# Patient Record
Sex: Female | Born: 1980 | Race: Black or African American | Hispanic: No | Marital: Single | Smoking: Never smoker
Health system: Southern US, Community
[De-identification: ages and names within clinical notes are randomized; demographics above are authoritative.]

## PROBLEM LIST (undated history)

## (undated) DIAGNOSIS — J45909 Unspecified asthma, uncomplicated: Secondary | ICD-10-CM

## (undated) DIAGNOSIS — D649 Anemia, unspecified: Secondary | ICD-10-CM

## (undated) HISTORY — PX: TUBAL LIGATION: SHX77

## (undated) HISTORY — PX: TONSILLECTOMY: SUR1361

---

## 2013-12-30 ENCOUNTER — Encounter (HOSPITAL_BASED_OUTPATIENT_CLINIC_OR_DEPARTMENT_OTHER): Payer: Self-pay | Admitting: Emergency Medicine

## 2013-12-30 ENCOUNTER — Emergency Department (HOSPITAL_BASED_OUTPATIENT_CLINIC_OR_DEPARTMENT_OTHER): Payer: Self-pay

## 2013-12-30 ENCOUNTER — Emergency Department (HOSPITAL_BASED_OUTPATIENT_CLINIC_OR_DEPARTMENT_OTHER)
Admission: EM | Admit: 2013-12-30 | Discharge: 2013-12-31 | Payer: Self-pay | Attending: Emergency Medicine | Admitting: Emergency Medicine

## 2013-12-30 DIAGNOSIS — Z79899 Other long term (current) drug therapy: Secondary | ICD-10-CM | POA: Insufficient documentation

## 2013-12-30 DIAGNOSIS — Z87891 Personal history of nicotine dependence: Secondary | ICD-10-CM | POA: Insufficient documentation

## 2013-12-30 DIAGNOSIS — J45901 Unspecified asthma with (acute) exacerbation: Secondary | ICD-10-CM | POA: Insufficient documentation

## 2013-12-30 HISTORY — DX: Unspecified asthma, uncomplicated: J45.909

## 2013-12-30 LAB — CBC WITH DIFFERENTIAL/PLATELET
BASOS PCT: 1 % (ref 0–1)
Basophils Absolute: 0 10*3/uL (ref 0.0–0.1)
Eosinophils Absolute: 1.3 10*3/uL — ABNORMAL HIGH (ref 0.0–0.7)
Eosinophils Relative: 15 % — ABNORMAL HIGH (ref 0–5)
HEMATOCRIT: 35.3 % — AB (ref 36.0–46.0)
HEMOGLOBIN: 11.8 g/dL — AB (ref 12.0–15.0)
LYMPHS ABS: 2.9 10*3/uL (ref 0.7–4.0)
Lymphocytes Relative: 33 % (ref 12–46)
MCH: 25.7 pg — ABNORMAL LOW (ref 26.0–34.0)
MCHC: 33.4 g/dL (ref 30.0–36.0)
MCV: 76.7 fL — ABNORMAL LOW (ref 78.0–100.0)
MONO ABS: 0.6 10*3/uL (ref 0.1–1.0)
MONOS PCT: 7 % (ref 3–12)
NEUTROS ABS: 4 10*3/uL (ref 1.7–7.7)
Neutrophils Relative %: 45 % (ref 43–77)
Platelets: 479 10*3/uL — ABNORMAL HIGH (ref 150–400)
RBC: 4.6 MIL/uL (ref 3.87–5.11)
RDW: 15.5 % (ref 11.5–15.5)
WBC: 8.8 10*3/uL (ref 4.0–10.5)

## 2013-12-30 LAB — BASIC METABOLIC PANEL
BUN: 12 mg/dL (ref 6–23)
CHLORIDE: 103 meq/L (ref 96–112)
CO2: 21 meq/L (ref 19–32)
Calcium: 9.9 mg/dL (ref 8.4–10.5)
Creatinine, Ser: 0.9 mg/dL (ref 0.50–1.10)
GFR calc non Af Amer: 82 mL/min — ABNORMAL LOW (ref >90)
Glucose, Bld: 126 mg/dL — ABNORMAL HIGH (ref 70–99)
POTASSIUM: 3.3 meq/L — AB (ref 3.7–5.3)
Sodium: 142 mEq/L (ref 137–147)

## 2013-12-30 MED ORDER — SODIUM CHLORIDE 0.9 % IV SOLN
Freq: Once | INTRAVENOUS | Status: AC
  Administered 2013-12-30: 1000 mL via INTRAVENOUS

## 2013-12-30 MED ORDER — ALBUTEROL SULFATE (2.5 MG/3ML) 0.083% IN NEBU
5.0000 mg | INHALATION_SOLUTION | Freq: Once | RESPIRATORY_TRACT | Status: AC
  Administered 2013-12-30: 5 mg via RESPIRATORY_TRACT
  Filled 2013-12-30: qty 6

## 2013-12-30 MED ORDER — MAGNESIUM SULFATE 50 % IJ SOLN
1.0000 g | Freq: Once | INTRAMUSCULAR | Status: AC
  Administered 2013-12-31: 1 g via INTRAVENOUS
  Filled 2013-12-30: qty 2

## 2013-12-30 MED ORDER — METHYLPREDNISOLONE SODIUM SUCC 125 MG IJ SOLR
125.0000 mg | Freq: Once | INTRAMUSCULAR | Status: AC
  Administered 2013-12-30: 125 mg via INTRAVENOUS
  Filled 2013-12-30: qty 2

## 2013-12-30 MED ORDER — IPRATROPIUM BROMIDE 0.02 % IN SOLN
0.5000 mg | Freq: Once | RESPIRATORY_TRACT | Status: AC
  Administered 2013-12-30: 0.5 mg via RESPIRATORY_TRACT
  Filled 2013-12-30: qty 2.5

## 2013-12-30 MED ORDER — ALBUTEROL (5 MG/ML) CONTINUOUS INHALATION SOLN
10.0000 mg/h | INHALATION_SOLUTION | RESPIRATORY_TRACT | Status: AC
  Administered 2013-12-30: 10 mg/h via RESPIRATORY_TRACT
  Filled 2013-12-30: qty 20

## 2013-12-30 NOTE — ED Provider Notes (Signed)
CSN: [id number]     Arrival date & time 12/30/13  2005 History   First MD Initiated Contact with Patient 12/30/13 2054     Chief Complaint  Patient presents with  . Shortness of Breath     (Consider location/radiation/quality/duration/timing/severity/associated sxs/prior Treatment) Patient is a 33 y.o. female presenting with shortness of breath. The history is provided by the patient. No language interpreter was used.  Shortness of Breath Severity:  Severe Onset quality:  Gradual Associated symptoms: wheezing   Associated symptoms: no abdominal pain, no chest pain, no fever, no headaches, no rash and no vomiting   Associated symptoms comment:  The patient has a history of asthma. She reports increased wheezing without fever and being out of her inhaler until today. She reports persistent worsening wheezing and SOB throughout the day without relief with inhaler or with use of her mother's nebulizer machine. She denies ever being hospitalized for her asthma.   Past Medical History  Diagnosis Date  . Asthma    Past Surgical History  Procedure Laterality Date  . Tubal ligation    . Tonsillectomy     No family history on file. History  Substance Use Topics  . Smoking status: Former Games developer  . Smokeless tobacco: Never Used  . Alcohol Use: Yes     Comment: occasional   OB History   Grav Para Term Preterm Abortions TAB SAB Ect Mult Living                 Review of Systems  Constitutional: Negative for fever.  HENT: Negative for congestion.   Respiratory: Positive for chest tightness, shortness of breath and wheezing.   Cardiovascular: Negative for chest pain.  Gastrointestinal: Negative for vomiting and abdominal pain.  Musculoskeletal: Negative for myalgias.  Skin: Negative for rash.  Neurological: Negative for weakness and headaches.      Allergies  Review of patient's allergies indicates no known allergies.  Home Medications   Current Outpatient Rx  Name  Route   Sig  Dispense  Refill  . albuterol (PROVENTIL HFA;VENTOLIN HFA) 108 (90 BASE) MCG/ACT inhaler   Inhalation   Inhale into the lungs every 6 (six) hours as needed for wheezing or shortness of breath.         . guaiFENesin (MUCINEX) 600 MG 12 hr tablet   Oral   Take by mouth 2 (two) times daily.          BP 120/71  Pulse 119  Temp(Src) 99 F (37.2 C) (Oral)  Resp 25  Ht 5\' 5"  (1.651 m)  Wt 160 lb (72.576 kg)  BMI 26.63 kg/m2  SpO2 96%  LMP 12/23/2013 Physical Exam  Constitutional: She is oriented to person, place, and time. She appears well-developed and well-nourished.  HENT:  Head: Normocephalic.  Neck: Normal range of motion. Neck supple.  Cardiovascular: Normal rate and regular rhythm.   Pulmonary/Chest: Effort normal. She has wheezes. She has no rales. She exhibits no tenderness.  Abdominal: Soft. Bowel sounds are normal. There is no tenderness. There is no rebound and no guarding.  Musculoskeletal: Normal range of motion.  Neurological: She is alert and oriented to person, place, and time.  Skin: Skin is warm and dry. No rash noted.  Psychiatric: She has a normal mood and affect.    ED Course  Procedures (including critical care time) Labs Review Labs Reviewed  CBC WITH DIFFERENTIAL - Abnormal; Notable for the following:    Hemoglobin 11.8 (*)    HCT 35.3 (*)  MCV 76.7 (*)    MCH 25.7 (*)    Platelets 479 (*)    Eosinophils Relative 15 (*)    Eosinophils Absolute 1.3 (*)    All other components within normal limits  BASIC METABOLIC PANEL - Abnormal; Notable for the following:    Potassium 3.3 (*)    Glucose, Bld 126 (*)    GFR calc non Af Amer 82 (*)    All other components within normal limits   Imaging Review No results found.   EKG Interpretation None      MDM   Final diagnoses:  None    1. Asthma exacerbation  She has had nebulizer treatments x 2 with 5 mg Albuterol (2) and ATrovent 0.5 (1), as well as a 10 mg Albuterol one hour  treatment without significant change in wheezing. Patient still maintaining O2 saturations of upper 90's, however has prominent persistent wheeze with prolonged expirations. Magnesium and Solumedrol given. Will admit for further management of asthma exacerbation. [facility].     Arnoldo HookerShari A Hassen Bruun, [place] JerseyPA-C 12/30/13 2333

## 2013-12-30 NOTE — ED Notes (Signed)
Out of inhaler for asthma- just refilled and used x 1 day without relief- RT evaluating in triage

## 2013-12-30 NOTE — ED Provider Notes (Signed)
Medical screening examination/treatment/procedure(s) were performed by non-physician practitioner and as supervising physician I was immediately available for consultation/collaboration.   EKG Interpretation None       Ethelda ChickMartha K Linker, MD 12/30/13 [phone]

## 2013-12-31 LAB — I-STAT ARTERIAL BLOOD GAS, ED
Acid-Base Excess: 1 mmol/L (ref 0.0–2.0)
Bicarbonate: 21.7 mEq/L (ref 20.0–24.0)
O2 Saturation: 96 %
PO2 ART: 66 mmHg — AB (ref 80.0–100.0)
Patient temperature: 98.3
TCO2: 22 mmol/L (ref 0–100)
pCO2 arterial: 23.5 mmHg — ABNORMAL LOW (ref 35.0–45.0)
pH, Arterial: 7.573 — ABNORMAL HIGH (ref 7.350–7.450)

## 2013-12-31 NOTE — ED Notes (Signed)
[facility]

## 2015-01-12 ENCOUNTER — Encounter (HOSPITAL_BASED_OUTPATIENT_CLINIC_OR_DEPARTMENT_OTHER): Payer: Self-pay

## 2015-01-12 ENCOUNTER — Emergency Department (HOSPITAL_BASED_OUTPATIENT_CLINIC_OR_DEPARTMENT_OTHER): Payer: No Typology Code available for payment source

## 2015-01-12 ENCOUNTER — Emergency Department (HOSPITAL_BASED_OUTPATIENT_CLINIC_OR_DEPARTMENT_OTHER)
Admission: EM | Admit: 2015-01-12 | Discharge: 2015-01-12 | Disposition: A | Discharge status: Home / Self Care | Payer: No Typology Code available for payment source | Attending: Emergency Medicine | Admitting: Emergency Medicine

## 2015-01-12 DIAGNOSIS — T148XXA Other injury of unspecified body region, initial encounter: Secondary | ICD-10-CM

## 2015-01-12 DIAGNOSIS — J45909 Unspecified asthma, uncomplicated: Secondary | ICD-10-CM | POA: Diagnosis not present

## 2015-01-12 DIAGNOSIS — Z87891 Personal history of nicotine dependence: Secondary | ICD-10-CM | POA: Diagnosis not present

## 2015-01-12 DIAGNOSIS — D72829 Elevated white blood cell count, unspecified: Secondary | ICD-10-CM | POA: Diagnosis not present

## 2015-01-12 DIAGNOSIS — S46911A Strain of unspecified muscle, fascia and tendon at shoulder and upper arm level, right arm, initial encounter: Secondary | ICD-10-CM | POA: Diagnosis not present

## 2015-01-12 DIAGNOSIS — S3992XA Unspecified injury of lower back, initial encounter: Secondary | ICD-10-CM | POA: Diagnosis present

## 2015-01-12 DIAGNOSIS — Z3202 Encounter for pregnancy test, result negative: Secondary | ICD-10-CM | POA: Insufficient documentation

## 2015-01-12 DIAGNOSIS — S39012A Strain of muscle, fascia and tendon of lower back, initial encounter: Secondary | ICD-10-CM | POA: Diagnosis not present

## 2015-01-12 DIAGNOSIS — Y92410 Unspecified street and highway as the place of occurrence of the external cause: Secondary | ICD-10-CM | POA: Insufficient documentation

## 2015-01-12 DIAGNOSIS — M791 Myalgia, unspecified site: Secondary | ICD-10-CM

## 2015-01-12 DIAGNOSIS — Z79899 Other long term (current) drug therapy: Secondary | ICD-10-CM | POA: Insufficient documentation

## 2015-01-12 DIAGNOSIS — Y999 Unspecified external cause status: Secondary | ICD-10-CM | POA: Insufficient documentation

## 2015-01-12 DIAGNOSIS — S161XXA Strain of muscle, fascia and tendon at neck level, initial encounter: Secondary | ICD-10-CM | POA: Diagnosis not present

## 2015-01-12 DIAGNOSIS — Y9389 Activity, other specified: Secondary | ICD-10-CM | POA: Diagnosis not present

## 2015-01-12 LAB — URINALYSIS, ROUTINE W REFLEX MICROSCOPIC
Bilirubin Urine: NEGATIVE
Glucose, UA: NEGATIVE mg/dL
Ketones, ur: NEGATIVE mg/dL
Nitrite: NEGATIVE
PH: 6 (ref 5.0–8.0)
PROTEIN: NEGATIVE mg/dL
Specific Gravity, Urine: 1.021 (ref 1.005–1.030)
Urobilinogen, UA: 1 mg/dL (ref 0.0–1.0)

## 2015-01-12 LAB — URINE MICROSCOPIC-ADD ON

## 2015-01-12 LAB — PREGNANCY, URINE: PREG TEST UR: NEGATIVE

## 2015-01-12 MED ORDER — CYCLOBENZAPRINE HCL 10 MG PO TABS: 5.0000 mg | ORAL_TABLET | Freq: Every day | ORAL | Status: DC

## 2015-01-12 MED ORDER — METRONIDAZOLE 500 MG PO TABS: 500.0000 mg | ORAL_TABLET | Freq: Two times a day (BID) | ORAL | Status: DC
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
## 2015-01-12 NOTE — Discharge Instructions (Signed)
Please call your doctor for a followup appointment within 24-48 hours. When you talk to your doctor please let them know that you were seen in the emergency department and have them acquire all of your records so that they can discuss the findings with you and formulate a treatment plan to fully care for your new and ongoing problems. Please follow-up with health and wellness Center Please follow-up with orthopedics Please rest and stay hydrated Please apply warm compressions and massage Please avoid any physical or strenuous activity Please follow up with women's outpatient clinic regarding Trich noted in urine - please have partner(s) within the past 6 months tested as well. Will need further work up for possible sexually transmitted disease (STD) Please take medications as prescribed. While taking Flexeril this is a muscle relaxer, this can cause drowsiness-please no drinking alcohol, driving, operating any heavy machinery. Please continue to monitor symptoms closely and if symptoms are to worsen or change (fever greater than 101, chills, sweating, nausea, vomiting, chest pain, shortness of breathe, difficulty breathing, weakness, numbness, tingling, worsening or changes to pain pattern, headache, dizziness, loss of sensation, blurred vision, sudden loss of vision, and abuse swallow, neck swelling, neck pain, throat closing sensation, inability to control urine or bowel movements, fall, injury) please report back to the Emergency Department immediately.   Lumbosacral Strain Lumbosacral strain is a strain of any of the parts that make up your lumbosacral vertebrae. Your lumbosacral vertebrae are the bones that make up the lower third of your backbone. Your lumbosacral vertebrae are held together by muscles and tough, fibrous tissue (ligaments).  CAUSES  A sudden blow to your back can cause lumbosacral strain. Also, anything that causes an excessive stretch of the muscles in the low back can cause this  strain. This is typically seen when people exert themselves strenuously, fall, lift heavy objects, bend, or crouch repeatedly. RISK FACTORS  Physically demanding work.  Participation in pushing or pulling sports or sports that require a sudden twist of the back (tennis, golf, baseball).  Weight lifting.  Excessive lower back curvature.  Forward-tilted pelvis.  Weak back or abdominal muscles or both.  Tight hamstrings. SIGNS AND SYMPTOMS  Lumbosacral strain may cause pain in the area of your injury or pain that moves (radiates) down your leg.  DIAGNOSIS Your health care provider can often diagnose lumbosacral strain through a physical exam. In some cases, you may need tests such as X-ray exams.  TREATMENT  Treatment for your lower back injury depends on many factors that your clinician will have to evaluate. However, most treatment will include the use of anti-inflammatory medicines. HOME CARE INSTRUCTIONS   Avoid hard physical activities (tennis, racquetball, waterskiing) if you are not in proper physical condition for it. This may aggravate or create problems.  If you have a back problem, avoid sports requiring sudden body movements. Swimming and walking are generally safer activities.  Maintain good posture.  Maintain a healthy weight.  For acute conditions, you may put ice on the injured area.  Put ice in a plastic bag.  Place a towel between your skin and the bag.  Leave the ice on for 20 minutes, 2-3 times a day.  When the low back starts healing, stretching and strengthening exercises may be recommended. SEEK MEDICAL CARE IF:  Your back pain is getting worse.  You experience severe back pain not relieved with medicines. SEEK IMMEDIATE MEDICAL CARE IF:   You have numbness, tingling, weakness, or problems with the use of  your arms or legs.  There is a change in bowel or bladder control.  You have increasing pain in any area of the body, including your belly  (abdomen).  You notice shortness of breath, dizziness, or feel faint.  You feel sick to your stomach (nauseous), are throwing up (vomiting), or become sweaty.  You notice discoloration of your toes or legs, or your feet get very cold. MAKE SURE YOU:   Understand these instructions.  Will watch your condition.  Will get help right away if you are not doing well or get worse. Document Released: 06/17/2005 Document Revised: 09/12/2013 Document Reviewed: 04/26/2013 [facility] Patient Information 2015 [place], [place]. This information is not intended to replace advice given to you by your health care provider. Make sure you discuss any questions you have with your health care provider.  Muscle Pain Muscle pain (myalgia) may be caused by many things, including:  Overuse or muscle strain, especially if you are not in shape. This is the most common cause of muscle pain.  Injury.  Bruises.  Viruses, such as the flu.  Infectious diseases.  Fibromyalgia, which is a chronic condition that causes muscle tenderness, fatigue, and headache.  Autoimmune diseases, including lupus.  Certain drugs, including ACE inhibitors and statins. Muscle pain may be mild or severe. In most cases, the pain lasts only a short time and goes away without treatment. To diagnose the cause of your muscle pain, your health care provider will take your medical history. This means he or she will ask you when your muscle pain began and what has been happening. If you have not had muscle pain for very long, your health care provider may want to wait before doing much testing. If your muscle pain has lasted a long time, your health care provider may want to run tests right away. If your health care provider thinks your muscle pain may be caused by illness, you may need to have additional tests to rule out certain conditions.  Treatment for muscle pain depends on the cause. Home care is often enough to relieve muscle pain. Your  health care provider may also prescribe anti-inflammatory medicine. HOME CARE INSTRUCTIONS Watch your condition for any changes. The following actions may help to lessen any discomfort you are feeling:  Only take over-the-counter or prescription medicines as directed by your health care provider.  Apply ice to the sore muscle:  Put ice in a plastic bag.  Place a towel between your skin and the bag.  Leave the ice on for 15-20 minutes, 3-4 times a day.  You may alternate applying hot and cold packs to the muscle as directed by your health care provider.  If overuse is causing your muscle pain, slow down your activities until the pain goes away.  Remember that it is normal to feel some muscle pain after starting a workout program. Muscles that have not been used often will be sore at first.  Do regular, gentle exercises if you are not usually active.  Warm up before exercising to lower your risk of muscle pain.  Do not continue working out if the pain is very bad. Bad pain could mean you have injured a muscle. SEEK MEDICAL CARE IF:  Your muscle pain gets worse, and medicines do not help.  You have muscle pain that lasts longer than 3 days.  You have a rash or fever along with muscle pain.  You have muscle pain after a tick bite.  You have muscle pain while  working out, even though you are in good physical condition.  You have redness, soreness, or swelling along with muscle pain.  You have muscle pain after starting a new medicine or changing the dose of a medicine. SEEK IMMEDIATE MEDICAL CARE IF:  You have trouble breathing.  You have trouble swallowing.  You have muscle pain along with a stiff neck, fever, and vomiting.  You have severe muscle weakness or cannot move part of your body. MAKE SURE YOU:   Understand these instructions.  Will watch your condition.  Will get help right away if you are not doing well or get worse. Document Released: 07/30/2006  Document Revised: 09/12/2013 Document Reviewed: 07/04/2013 [facility] Patient Information 2015 [place], [place]. This information is not intended to replace advice given to you by your health care provider. Make sure you discuss any questions you have with your health care provider.   Emergency Department Resource Guide 1) Find a Doctor and Pay Out of Pocket Although you won't have to find out who is covered by your insurance plan, it is a good idea to ask around and get recommendations. You will then need to call the office and see if the doctor you have chosen will accept you as a new patient and what types of options they offer for patients who are self-pay. Some doctors offer discounts or will set up payment plans for their patients who do not have insurance, but you will need to ask so you aren't surprised when you get to your appointment.  2) Contact Your Local Health Department Not all health departments have doctors that can see patients for sick visits, but many do, so it is worth a call to see if yours does. If you don't know where your local health department is, you can check in your phone book. The CDC also has a tool to help you locate your state's health department, and many state websites also have listings of all of their local health departments.  3) Find a Walk-in Clinic If your illness is not likely to be very severe or complicated, you may want to try a walk in clinic. These are popping up all over the country in pharmacies, drugstores, and shopping centers. They're usually staffed by nurse practitioners or physician assistants that have been trained to treat common illnesses and complaints. They're usually fairly quick and inexpensive. However, if you have serious medical issues or chronic medical problems, these are probably not your best option.  No Primary Care Doctor: - Call Health Connect at  [phone] - they can help you locate a primary care doctor that  accepts your  insurance, provides certain services, etc. - Physician Referral Service- [phone]  Chronic Pain Problems: Organization         Address  Phone   Notes  [facility]  [phone] Patients need to be referred by their primary care doctor.   Medication Assistance: Organization         Address  Phone   Notes  [facility] Medication [facility] [address] [place] [place], [place] [address] [phone] --Must be a resident of [facility] -- Must have NO insurance coverage whatsoever (no Medicaid/ Medicare, etc.) -- The pt. MUST have a primary care doctor that directs their care regularly and follows them in the community   [facility]ist  [phone]   [employer]  [phone]    Agencies that provide inexpensive medical care: Organization  Address  Phone   Notes  [facility]  [phone]   [facility]    [phone]   [facility] [address] [place], [place] [address] [phone]   [facility] [address], [place] [phone](336) [phone]   Planned Parenthood    [phone]   [facility]    [phone](336) [phone]   [facility]  [address], [place] Phone:  [phone](336) [phone], Fax:  [phone] Hours of Operation:  9 am - 6 pm, M-F.  Also accepts Medicaid/Medicare and self-pay.  [facility]  [address], [place] Phone: [phone]) [phone], Fax: [phone]) [phone]. Hours of Operation:  8:30 am - 5:30 pm, M-F.  Also accepts Medicaid and self-pay.  [facility] [place] [address], [place] Phone: [phone]) [phone]   [facility] [address], [place], [place] [phone], Ext. [phone] Mondays & Thursdays: 7-9 AM.  First 15 patients are seen on a first come, first serve basis.    Medicaid-accepting [facility] Providers:  Organization          Address  Phone   Notes  [facility] [address], [place] [phone]) [phone] Also accepts self-pay patients.  [facility] [address], [place]  [phone]) [phone]   [facility] [address], [address], [place] [phone](336) [phone]   [facility] [address] [place] [address], [place] [phone]) [phone]   Renaye RakersVeita Bland [address], [place]   [phone] Only accepts [place] Access [place] patients after they have their name applied to their card.   Self-Pay (no insurance) in [facility]:  Organization         Address  Phone   Notes  Sickle Cell Patients, [facility] [address], [place] [phone]) [phone]   [facility] [address], [place] [phone]) [phone]   [facility] [place]  [address], [place] [phone]) [phone]   [facility]/Dr. Osei-Bonsu  [address] [place] [address], [place] or [phone], [place] [phone] Phone number for both [place] and [place] locations is the same.  [facility] [address], [place] [phone]   [facility] [address] [place] [address], [place] or [address] [phone](336) [phone]   [facility] [address], [place] [phone]) [phone], phone; [phone]) [phone], fax Sees patients 1st and 3rd Saturday of every month.  Must not qualify for public or private insurance (i.e. Medicaid, Medicare, [place] Health Choice, Veterans' Benefits)  Household income should be no more than 200% of the poverty level The clinic cannot treat you if you are pregnant or think you are pregnant  Sexually transmitted diseases are not treated at the clinic.    Dental Care: Organization         Address  Phone  Notes  [facility] Department of [facility] [address],  [place] [phone]) [phone] Accepts children up to age 35 who are enrolled in [place] or [place] Health Choice; pregnant women with a Medicaid card; and children who have applied for Medicaid or [employer] Health Choice, but were declined, whose parents can pay a reduced fee at time  of service.  [facility] Department of [facility]  [address], [place] [phone] Accepts children up to age 7 who are enrolled in [place] or [place] Health Choice; pregnant women with a Medicaid card; and children who have applied for Medicaid or [place] Health Choice, but were declined, whose parents can pay a reduced fee at time of service.  [facility]  [address] [place], [place] [phone] Patients are seen by appointment only. Walk-ins are not accepted. [facility] will see patients 62 years of age and older. Monday - Tuesday (8am-5pm) Most Wednesdays (8:30-5pm) $30 per visit, cash only  [facility] Access [facility]  [address], [facility] [phone] Patients are seen by appointment only. Walk-ins are not accepted. [facility] will see patients 76 years of age and older. One Wednesday Evening (Monthly: Volunteer Based).  $30 per visit, cash only  [employer]  [phone] for adults; Children under age 49, call Graduate Pediatric Dentistry at [phone]. Children aged 27-14, please call [phone] to request a pediatric application.  Dental services are provided in all areas of dental care including fillings, crowns and bridges, complete and partial dentures, implants, gum treatment, root canals, and extractions. Preventive care is also provided. Treatment is provided to both adults and children. Patients are selected via a lottery and there is often a waiting list.   [facility] [address] [place]  [phone] [email or web address]   [facility] [address] [place], [place]  [phone], Ext. [phone] Second and Fourth Thursday of each month, opens at 6:30 AM; Clinic ends at 9 AM.  Patients are seen on a first-come first-served basis, and a limited number are seen during each clinic.   [facility]  [address] [place] [place], [place] [phone]   Eligibility Requirements You must have lived in [place], [place], or [place] counties for at least the last three months.   You cannot be eligible for state or federal sponsored [place], including [employer], [place], or [employer].   You generally cannot be eligible for healthcare insurance through your employer.    How to apply: Eligibility screenings are held every Tuesday and Wednesday afternoon from 1:00 pm until 4:00 pm. You do not need an appointment for the interview!  [facility] [address], [place], [place] [phone]   [facility] Health Department  [phone]   [facility] Health Department  [phone]   [facility] Health Department  [phone]    Behavioral Health Resources in the Community: Intensive Outpatient Programs Organization         Address  Phone  Notes  [facility] Services [address] [place], [place] [phone]   [facility] Outpatient [address], [place], [place] [phone]   [facility] [address], [place], [place]  [phone]   [facility] Mental Health [address],  [place], [place] [phone] or [phone]   Substance Abuse Resources Organization         Address  Phone  Notes  [facility] Drug Services  [phone]   [facility]  [phone]   [facility] [place]  [phone]   Floydene Flock  [phone]   Residential & Outpatient Substance Abuse Program  [phone]   Psychological Services Organization         Address  Phone  Notes  [facility]  [phone]- [phone]   [facility]  [phone]- [phone]    [facility] [address], [place] [phone] or [phone]    Mobile Crisis Teams Organization         Address  Phone  Notes  [facility], Mobile Crisis Care Unit  [phone]   [facility]  [address] [place], [place] [phone]   Doristine LocksSharon DeEsch [address] [place] [phone]    Self-Help/Support Groups Organization         Address  Phone             Notes  [facility] [place] - variety of support groups  [phone]- [phone] Call for more information  Narcotics Anonymous (NA), [facility] [address], [place]  2 meetings at this location   Statisticianesidential Treatment Programs Organization         Address  Phone  Notes  [facility] [address],    [place]  [phone]   [facility]  [address], [place] [phone], [place], [place] [phone]   [facility] [address], [place] [phone] Admissions: 8am-3pm M-F  [facility] [address]    [place], [place] [phone]   [facility] [address], [place], [place] [phone]   [facility] [address]  [place], [place] [phone]   [facility] - Intensive Outpatient [address], [place], [place] [phone]   [facility] ([facility] [address]  [place], [place] [phone] or [phone]   [facility] ([facility]) [address] [place], [place] [phone] Accepts Medicaid  [facility] [address]  [place] [phone] Substance Abuse/Addiction Treatment   [facility] Behavioral Health Resources Organization         Address  Phone  Notes  [facility]  [phone]   Angie FavaJulie Brannon, PhD [address], [address] [place], [place]   [phone](336) [phone] or [phone]) [phone]   [facility]   [address] [place], [place] [phone](336) [phone]   [facility] [address], [place], [place] [phone]) [phone] Insurance/Medicaid/sponsorship through [facility]  [facility] [address]                                    [place], [place] [phone]) [phone] Therapy/tele-psych/case  [facility] [address].   [place], [place] [phone](336) [phone]    Dr. Lolly MustacheArfeen  [phone](336) [phone]   [facility] [place]  [employer] [facility] 1) [address], [place] 2) [address], [place] 3)  [address], [place] [phone]) [phone](336) [phone]  [phone](336) [phone]   [facility] Child Abuse Hotline [phone](336) [phone] or [phone](336) [phone] (After Hours)
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
## 2015-02-16 ENCOUNTER — Encounter (HOSPITAL_BASED_OUTPATIENT_CLINIC_OR_DEPARTMENT_OTHER): Payer: Self-pay

## 2015-02-16 ENCOUNTER — Emergency Department (HOSPITAL_BASED_OUTPATIENT_CLINIC_OR_DEPARTMENT_OTHER)
Admission: EM | Admit: 2015-02-16 | Discharge: 2015-02-16 | Disposition: A | Discharge status: Home / Self Care | Payer: Self-pay | Attending: Emergency Medicine | Admitting: Emergency Medicine

## 2015-02-16 DIAGNOSIS — Z3202 Encounter for pregnancy test, result negative: Secondary | ICD-10-CM | POA: Insufficient documentation

## 2015-02-16 DIAGNOSIS — N390 Urinary tract infection, site not specified: Secondary | ICD-10-CM | POA: Insufficient documentation

## 2015-02-16 DIAGNOSIS — Z79899 Other long term (current) drug therapy: Secondary | ICD-10-CM | POA: Insufficient documentation

## 2015-02-16 DIAGNOSIS — Z87891 Personal history of nicotine dependence: Secondary | ICD-10-CM | POA: Insufficient documentation

## 2015-02-16 DIAGNOSIS — Z792 Long term (current) use of antibiotics: Secondary | ICD-10-CM | POA: Insufficient documentation

## 2015-02-16 DIAGNOSIS — J45909 Unspecified asthma, uncomplicated: Secondary | ICD-10-CM | POA: Insufficient documentation

## 2015-02-16 LAB — URINE MICROSCOPIC-ADD ON

## 2015-02-16 LAB — URINALYSIS, ROUTINE W REFLEX MICROSCOPIC
BILIRUBIN URINE: NEGATIVE
Glucose, UA: NEGATIVE mg/dL
Ketones, ur: NEGATIVE mg/dL
Nitrite: POSITIVE — AB
PH: 6 (ref 5.0–8.0)
Protein, ur: NEGATIVE mg/dL
Specific Gravity, Urine: 1.025 (ref 1.005–1.030)
Urobilinogen, UA: 0.2 mg/dL (ref 0.0–1.0)

## 2015-02-16 LAB — PREGNANCY, URINE: PREG TEST UR: NEGATIVE

## 2015-02-16 MED ORDER — SULFAMETHOXAZOLE-TRIMETHOPRIM 800-160 MG PO TABS
1.0000 | ORAL_TABLET | Freq: Once | ORAL | Status: AC
  Administered 2015-02-16: 1 via ORAL
  Filled 2015-02-16: qty 1

## 2015-02-16 MED ORDER — SULFAMETHOXAZOLE-TRIMETHOPRIM 800-160 MG PO TABS: 1.0000 | ORAL_TABLET | Freq: Two times a day (BID) | ORAL | Status: AC

## 2015-02-16 MED ORDER — PHENAZOPYRIDINE HCL 200 MG PO TABS: 200.0000 mg | ORAL_TABLET | Freq: Three times a day (TID) | ORAL | Status: DC

## 2015-02-16 MED ORDER — PHENAZOPYRIDINE HCL 100 MG PO TABS
100.0000 mg | ORAL_TABLET | Freq: Once | ORAL | Status: AC
  Administered 2015-02-16: 100 mg via ORAL
  Filled 2015-02-16: qty 1

## 2015-02-16 NOTE — ED Provider Notes (Signed)
CSN: [id number]     Arrival date & time 02/16/15  1329 History   First MD Initiated Contact with Patient 02/16/15 1340     Chief Complaint  Patient presents with  . Dysuria      HPI  Patient presents for evaluation of burning with urination and urinary frequency for last 3 days. Denies pregnancy. No fevers no chills. No back pain flank pain. No vaginal bleeding discharge or other abnormalities noted.  Past Medical History  Diagnosis Date  . Asthma    Past Surgical History  Procedure Laterality Date  . Tubal ligation    . Tonsillectomy     No family history on file. History  Substance Use Topics  . Smoking status: Former Games developermoker  . Smokeless tobacco: Never Used  . Alcohol Use: No     Comment: occasional   OB History    No data available     Review of Systems  Constitutional: Negative for fever, chills, diaphoresis, appetite change and fatigue.  HENT: Negative for mouth sores, sore throat and trouble swallowing.   Eyes: Negative for visual disturbance.  Respiratory: Negative for cough, chest tightness, shortness of breath and wheezing.   Cardiovascular: Negative for chest pain.  Gastrointestinal: Negative for nausea, vomiting, abdominal pain, diarrhea and abdominal distention.  Endocrine: Negative for polydipsia, polyphagia and polyuria.  Genitourinary: Positive for dysuria, urgency and frequency. Negative for hematuria.  Musculoskeletal: Negative for gait problem.  Skin: Negative for color change, pallor and rash.  Neurological: Negative for dizziness, syncope, light-headedness and headaches.  Hematological: Does not bruise/bleed easily.  Psychiatric/Behavioral: Negative for behavioral problems and confusion.      Allergies  Review of patient's allergies indicates no known allergies.  Home Medications   Prior to Admission medications   Medication Sig Start Date End Date Taking? Authorizing Provider  albuterol (PROVENTIL HFA;VENTOLIN HFA) 108 (90 BASE) MCG/ACT  inhaler Inhale into the lungs every 6 (six) hours as needed for wheezing or shortness of breath.    Historical Provider, MD  cyclobenzaprine (FLEXERIL) 10 MG tablet Take 0.5 tablets (5 mg total) by mouth at bedtime. 01/12/15   Marissa Sciacca, PA-C  guaiFENesin (MUCINEX) 600 MG 12 hr tablet Take by mouth 2 (two) times daily.    Historical Provider, MD  phenazopyridine (PYRIDIUM) 200 MG tablet Take 1 tablet (200 mg total) by mouth 3 (three) times daily. 02/16/15   Rolland PorterMark Shaquavia Whisonant, MD  sulfamethoxazole-trimethoprim (BACTRIM DS,SEPTRA DS) 800-160 MG per tablet Take 1 tablet by mouth 2 (two) times daily. 02/16/15 02/23/15  Rolland PorterMark Lauretta Sallas, MD   LMP 01/31/2015 Physical Exam  Constitutional: She is oriented to person, place, and time. She appears well-developed and well-nourished. No distress.  HENT:  Head: Normocephalic.  Eyes: Conjunctivae are normal. Pupils are equal, round, and reactive to light. No scleral icterus.  Neck: Normal range of motion. Neck supple. No thyromegaly present.  Cardiovascular: Normal rate and regular rhythm.  Exam reveals no gallop and no friction rub.   No murmur heard. Pulmonary/Chest: Effort normal and breath sounds normal. No respiratory distress. She has no wheezes. She has no rales.  Abdominal: Soft. Bowel sounds are normal. She exhibits no distension. There is no tenderness. There is no rebound.    Musculoskeletal: Normal range of motion.  Neurological: She is alert and oriented to person, place, and time.  Skin: Skin is warm and dry. No rash noted.  Psychiatric: She has a normal mood and affect. Her behavior is normal.    ED Course  Procedures (  including critical care time) Labs Review Labs Reviewed  URINALYSIS, ROUTINE W REFLEX MICROSCOPIC (NOT AT [facility]) - Abnormal; Notable for the following:    APPearance CLOUDY (*)    Hgb urine dipstick SMALL (*)    Nitrite POSITIVE (*)    Leukocytes, UA LARGE (*)    All other components within normal limits  URINE MICROSCOPIC-ADD  ON - Abnormal; Notable for the following:    Squamous Epithelial / LPF MANY (*)    Bacteria, UA MANY (*)    All other components within normal limits  PREGNANCY, URINE    Imaging Review No results found.   EKG Interpretation None      MDM   Final diagnoses:  UTI (lower urinary tract infection)    Patient's urine appears infected. Nonproductive. No symptoms or signs of upper tract disease. Plan Bactrim, Pyridium, primary care follow-up.    Rolland Porter, MD 02/16/15 [phone]

## 2015-02-16 NOTE — ED Notes (Signed)
Patient here with dysuria and urinary frequency x 3 days.

## 2015-02-16 NOTE — Discharge Instructions (Signed)

## 2015-04-14 ENCOUNTER — Encounter (HOSPITAL_BASED_OUTPATIENT_CLINIC_OR_DEPARTMENT_OTHER): Payer: Self-pay | Admitting: *Deleted

## 2015-04-14 ENCOUNTER — Emergency Department (HOSPITAL_BASED_OUTPATIENT_CLINIC_OR_DEPARTMENT_OTHER): Payer: 59

## 2015-04-14 ENCOUNTER — Emergency Department (HOSPITAL_BASED_OUTPATIENT_CLINIC_OR_DEPARTMENT_OTHER)
Admission: EM | Admit: 2015-04-14 | Discharge: 2015-04-14 | Disposition: A | Discharge status: Home / Self Care | Payer: 59 | Attending: Emergency Medicine | Admitting: Emergency Medicine

## 2015-04-14 DIAGNOSIS — Z3202 Encounter for pregnancy test, result negative: Secondary | ICD-10-CM | POA: Diagnosis not present

## 2015-04-14 DIAGNOSIS — J45901 Unspecified asthma with (acute) exacerbation: Secondary | ICD-10-CM | POA: Insufficient documentation

## 2015-04-14 DIAGNOSIS — R062 Wheezing: Secondary | ICD-10-CM | POA: Diagnosis present

## 2015-04-14 DIAGNOSIS — Z87891 Personal history of nicotine dependence: Secondary | ICD-10-CM | POA: Diagnosis not present

## 2015-04-14 DIAGNOSIS — N760 Acute vaginitis: Secondary | ICD-10-CM | POA: Diagnosis not present

## 2015-04-14 DIAGNOSIS — Z79899 Other long term (current) drug therapy: Secondary | ICD-10-CM | POA: Insufficient documentation

## 2015-04-14 DIAGNOSIS — B373 Candidiasis of vulva and vagina: Secondary | ICD-10-CM | POA: Diagnosis not present

## 2015-04-14 DIAGNOSIS — B9689 Other specified bacterial agents as the cause of diseases classified elsewhere: Secondary | ICD-10-CM

## 2015-04-14 DIAGNOSIS — B3731 Acute candidiasis of vulva and vagina: Secondary | ICD-10-CM

## 2015-04-14 DIAGNOSIS — R0602 Shortness of breath: Secondary | ICD-10-CM

## 2015-04-14 LAB — WET PREP, GENITAL: Trich, Wet Prep: NONE SEEN

## 2015-04-14 LAB — PREGNANCY, URINE: Preg Test, Ur: NEGATIVE

## 2015-04-14 LAB — CBG MONITORING, ED: GLUCOSE-CAPILLARY: 82 mg/dL (ref 65–99)

## 2015-04-14 MED ORDER — IPRATROPIUM-ALBUTEROL 0.5-2.5 (3) MG/3ML IN SOLN
3.0000 mL | Freq: Four times a day (QID) | RESPIRATORY_TRACT | Status: DC
  Administered 2015-04-14: 3 mL via RESPIRATORY_TRACT
  Filled 2015-04-14: qty 3

## 2015-04-14 MED ORDER — PREDNISONE 20 MG PO TABS: 40.0000 mg | ORAL_TABLET | Freq: Every day | ORAL | Status: DC

## 2015-04-14 MED ORDER — PREDNISONE 50 MG PO TABS
60.0000 mg | ORAL_TABLET | Freq: Once | ORAL | Status: AC
  Administered 2015-04-14: 60 mg via ORAL
  Filled 2015-04-14 (×2): qty 1

## 2015-04-14 MED ORDER — ALBUTEROL SULFATE HFA 108 (90 BASE) MCG/ACT IN AERS
2.0000 | INHALATION_SPRAY | RESPIRATORY_TRACT | Status: DC | PRN
  Filled 2015-04-14: qty 6.7

## 2015-04-14 MED ORDER — METRONIDAZOLE 500 MG PO TABS: 500.0000 mg | ORAL_TABLET | Freq: Two times a day (BID) | ORAL | Status: DC

## 2015-04-14 MED ORDER — ALBUTEROL SULFATE (2.5 MG/3ML) 0.083% IN NEBU
5.0000 mg | INHALATION_SOLUTION | Freq: Once | RESPIRATORY_TRACT | Status: AC
  Administered 2015-04-14: 5 mg via RESPIRATORY_TRACT
  Filled 2015-04-14: qty 6

## 2015-04-14 MED ORDER — FLUCONAZOLE 100 MG PO TABS
200.0000 mg | ORAL_TABLET | Freq: Once | ORAL | Status: AC
  Administered 2015-04-14: 200 mg via ORAL
  Filled 2015-04-14: qty 2

## 2015-04-14 MED ORDER — FLUCONAZOLE 200 MG PO TABS: 200.0000 mg | ORAL_TABLET | Freq: Once | ORAL | Status: DC

## 2015-04-14 MED ORDER — METRONIDAZOLE 500 MG PO TABS
500.0000 mg | ORAL_TABLET | Freq: Once | ORAL | Status: AC
  Administered 2015-04-14: 500 mg via ORAL
  Filled 2015-04-14: qty 1

## 2015-04-14 NOTE — Discharge Instructions (Signed)
°Take your antibiotics as directed and to completion. You should never have any leftover antibiotics! Push fluids and stay well hydrated.  ° °Any antibiotic use can reduce the efficacy of hormonal birth control. Please use back up method of contraception.  ° °Do not drink alcohol while you are taking flagyl (metronidazole) because it will make you very sick. ° °Do not hesitate to return to the emergency room for any new, worsening or concerning symptoms. ° °Please obtain primary care using resource guide below. Let them know that you were seen in the emergency room and that they will need to obtain records for further outpatient management. ° ° ° °Emergency Department Resource Guide °1) Find a Doctor and Pay Out of Pocket °Although you won't have to find out who is covered by your insurance plan, it is a good idea to ask around and get recommendations. You will then need to call the office and see if the doctor you have chosen will accept you as a new patient and what types of options they offer for patients who are self-pay. Some doctors offer discounts or will set up payment plans for their patients who do not have insurance, but you will need to ask so you aren't surprised when you get to your appointment. ° °2) Contact Your Local Health Department °Not all health departments have doctors that can see patients for sick visits, but many do, so it is worth a call to see if yours does. If you don't know where your local health department is, you can check in your phone book. The CDC also has a tool to help you locate your state's health department, and many state websites also have listings of all of their local health departments. ° °3) Find a Walk-in Clinic °If your illness is not likely to be very severe or complicated, you may want to try a walk in clinic. These are popping up all over the country in pharmacies, drugstores, and shopping centers. They're usually staffed by nurse practitioners or physician  assistants that have been trained to treat common illnesses and complaints. They're usually fairly quick and inexpensive. However, if you have serious medical issues or chronic medical problems, these are probably not your best option. ° °No Primary Care Doctor: °- Call [facility] at  832-8000 - they can help you locate a primary care doctor that  accepts your insurance, provides certain services, etc. °- Physician Referral Service- 1-800-533-3463 ° °Chronic Pain Problems: °Organization         Address  Phone   Notes  °[facility]  (336) 297-2271 Patients need to be referred by their primary care doctor.  ° °Medication Assistance: °Organization         Address  Phone   Notes  °[place] [facility] [address] °, [place] [address] (336) 641-8030 --Must be a resident of [place] °-- Must have NO insurance coverage whatsoever (no Medicaid/ Medicare, etc.) °-- The pt. MUST have a primary care doctor that directs their care regularly and follows them in the community °  °[facility]  (866) 331-1348   °[employer]  (888) 892-1162   ° °Agencies that provide inexpensive medical care: °Organization         Address  Phone   Notes  °[facility]  (336) 832-8035   °[facility]    (336) 832-7272   °[facility] [address] °, [place] [address] (  336) 832-4777   °[facility] [address], °[place] (336) 271-4999   °[facility]    (336) 373-0678   °[facility]    (336) 272-1050   °[facility] ° [address], [place] Phone:  (336) 832-4444, Fax:  (336) 832-4440 Hours of Operation:  9 am - 6 pm, M-F.  Also accepts Medicaid/Medicare and self-pay.  °[facility] ° [address], [place] Phone: (336) 832-3150, Fax: (336) 832-3151. Hours of Operation:  8:30 am - 5:30 pm, M-F.  Also accepts  Medicaid and self-pay.  °[facility] [place] [address], [place] Phone: (336) 878-6027   °[facility] [address], [place], [place] (336)723-1848, Ext. 123 Mondays & Thursdays: 7-9 AM.  First 15 patients are seen on a first come, first serve basis. °  ° °Medicaid-accepting [place] Providers: ° °Organization         Address  Phone   Notes  °[facility] [address], [place] (336) 641-2100 Also accepts self-pay patients.  °[facility] [address], [place] ° (336) 856-9996   °[facility] [address] [place] [address], [place] (336) 288-8857   °[facility] [address] [place] [address], [place] (336) 299-7000   °Veita Bland [address], [place]  ° (336) 373-1557 Only accepts [place] Access Medicaid patients after they have their name applied to their card.  ° °Self-Pay (no insurance) in [place]: ° °Organization         Address  Phone   Notes  °Sickle Cell Patients, [facility] [address], [place] (336) 832-1970   °[facility] [address], [place] (336) 832-4400   °[facility] ° [address], [place] (336) 992-4800   °[facility]/Dr. Osei-Bonsu ° [address] [place] [address], [place] or [address], [place] (336) 841-8500 Phone number for both [place] and [place] locations is the same.  °[facility] [address], [place] (336) 299-0000   °[facility] [address] [place] [address], [place] or [address] (336) 852-7530 °(336) 878-2260   °[facility] [address], [place] (336) 350-1642, phone; (336) 294-5005, fax Sees patients 1st and 3rd Saturday of every month.  Must not qualify for public or private insurance (i.e. Medicaid, Medicare, [place] Health Choice, Veterans' Benefits) • Household  income should be no more than 200% of the poverty level •The clinic cannot treat you if you are pregnant or think you are pregnant • Sexually transmitted diseases are not treated at the clinic.  ° ° °Dental Care: °Organization         Address  Phone  Notes  °[facility] [facility] [address],  (336) 641-6152 Accepts children up to age 21 who are enrolled in Medicaid or [place] Health Choice; pregnant women with a Medicaid card; and children who have applied for Medicaid or  Health Choice, but were declined, whose parents can pay a reduced fee at time of service.  °[facility] [place]  [address], [place] (336) 641-7733 Accepts children up to age 21 who are enrolled in Medicaid or  Health Choice; pregnant women with   a Medicaid card; and children who have applied for Medicaid or [place] Health Choice, but were declined, whose parents can pay a reduced fee at time of service.  °[facility] ° [address], [place] (336) 641-4533 Patients are seen by appointment only. Walk-ins are not accepted. [facility] will see patients 18 years of age and older. °Monday - Tuesday (8am-5pm) °Most Wednesdays (8:30-5pm) °$30 per visit, cash only  °[facility] ° [address], [place] (336) 641-4533 Patients are seen by appointment only. Walk-ins are not accepted. [facility] will see patients 18 years of age and older. °One Wednesday Evening (Monthly: Volunteer Based).  $30 per visit, cash only  °[facility]  (919) 537-3737 for adults; Children under age 4, call Graduate Pediatric Dentistry at (919) [phone]. Children aged 4-14, please call (919) [phone] to request a pediatric application. ° Dental services are provided in all areas of dental care including fillings, crowns and bridges, complete and partial dentures, implants, gum  treatment, root canals, and extractions. Preventive care is also provided. Treatment is provided to both adults and children. °Patients are selected via a lottery and there is often a waiting list. °  °[facility] [address], °[place] ° (336) 763-8833 [email or web address] °  °[facility] [address], [place], [place] (336)723-1848, Ext. 123 Second and Fourth Thursday of each month, opens at 6:30 AM; Clinic ends at 9 AM.  Patients are seen on a first-come first-served basis, and a limited number are seen during each clinic.  ° °[facility] ° [address], [place], [place] (336) 723-7904   Eligibility Requirements °You must have lived in [place], or [place] counties for at least the last three months. °  You cannot be eligible for state or federal sponsored healthcare insurance, including Veterans Administration, Medicaid, or Medicare. °  You generally cannot be eligible for healthcare insurance through your employer.  °  How to apply: °Eligibility screenings are held every Tuesday and Wednesday afternoon from 1:00 pm until 4:00 pm. You do not need an appointment for the interview!  °[facility] [address], [place], [place] 336-631-2330   °[facility]  336-342-8273   °[facility]  336-703-3100   °[facility]  336-570-6415   ° °Behavioral Health Resources in the Community: °Intensive Outpatient Programs °Organization         Address  Phone  Notes  °[facility] [address], [place], [place] 336-878-6098   °[facility] [address], [place], [place] 336-832-9800   °[facility] [address], [place], [place] ° 336-882-2125   °[facility] [address],  °[place],  1-800-853-5163 or 336-641-4981   °Substance Abuse Resources °Organization         Address  Phone  Notes  °[facility]  336-882-2125   °[facility]  336-784-9470   °[facility]  336-285-9073   °[facility]  336-845-3988   °Residential & Outpatient Substance Abuse Program  1-800-659-3381   °Psychological Services °Organization         Address  Phone  Notes  °[facility]  336- 832-9600   °[facility]  336- 378-7881   °[facility] [address], [place] 1-800-853-5163 or 336-641-4981   ° °Mobile Crisis Teams °Organization           Address  Phone  Notes  °[facility], Mobile Crisis Care Unit  1-877-626-1772   °Assertive °[facility] ° [address] [place], [place] 336-834-9664   °Sharon DeEsch [address] °[place] 336-554-5454   ° °Self-Help/Support Groups °Organization         Address  Phone             Notes  °[facility] - variety of support groups  336- 373-1402 Call for more information  °Narcotics Anonymous (NA), [facility] [address], °[place]  2 meetings at this location  ° °Residential Treatment Programs °Organization         Address  Phone  Notes  °[facility] [address],    °[place]  1-866-801-8205   °[facility] ° [address], [place], [place] 704-293-8524   °[facility] [address], [place] 336-845-3988 Admissions: 8am-3pm M-F  °[facility] [address]    °[place], [place] 336-841-1104   °[facility] [address], [place], [place] 336-379-7146   °[facility] [address]  °[place], [place] 336-285-9073   °[facility] Intensive Outpatient [address], [place], [place] 336-852-3033   °[facility] [address]  °[place], [place] 1-877-615-2722 or 336-784-9470   °[facility] [address] [place], [place] 336-227-7417 Accepts Medicaid  °[facility] [address]  °[place] 1-800-659-3381 Substance Abuse/Addiction Treatment  ° °[place] Behavioral Health Resources °Organization         Address  Phone  Notes  °[facility]  (888) 581-9988   °Julie Brannon, PhD [address] [place], [place]   (336) 349-5553 or (336) 951-0000   °[facility]   [address] °[place], [place] (336) 349-4454   °[facility] [address], [place], [place] (336) 342-8316 Insurance/Medicaid/sponsorship through [facility]  °[facility] [address]                                    [place], [place] (336) 342-8316 Therapy/tele-psych/case  °[facility] [address]  ° [place], [place] (336) 349-2233    °Dr. Arfeen  (336) 349-4544   °[facility] [place]  [employer] [place] [facility] 1) [address], [place] °2) [address], [place] °3)  [address], [place] (336) 349-3220 °(336) 342-7768 ° °(336) 342-8140   °[place] [facility] (336) 342-1394 or (336) 342-3537 (After Hours)    ° ° ° °

## 2015-04-14 NOTE — ED Notes (Signed)
Pt c/o wheezing x 1 day  With hx of asthma out of albuteral

## 2015-04-14 NOTE — ED Notes (Signed)
Albuterol mdi with spacer given to patient to take home, pt understands proper use. Spacer education provided.

## 2015-04-14 NOTE — ED Provider Notes (Signed)
CSN: [id number]     Arrival date & time 04/14/15  1338 History   First MD Initiated Contact with Patient 04/14/15 1450     Chief Complaint  Patient presents with  . Asthma     (Consider location/radiation/quality/duration/timing/severity/associated sxs/prior Treatment) HPI   Blood pressure 135/75, pulse 91, temperature 97.8 F (36.6 C), temperature source Oral, resp. rate 20, last menstrual period 04/10/2015, SpO2 100 %.  Carrie Nguyen is a 34 y.o. female complaining of Increasing dry cough, wheezing, shortness of breath, pleuritic chest pain, vaginal discharge. Patient has history of asthma/chronic bronchitis she's been uninsured for 3 years well she is taking care of her sick mother. Patient reports that she was seen and given a prescription for trichomonas, she developed a UTI after that and thinks that she now has a yeast infection. She denies abdominal pain, fever, chills, increasing peripheral edema, calf pain or leg swelling, exogenous, history of DVT or PE.  Past Medical History  Diagnosis Date  . Asthma    Past Surgical History  Procedure Laterality Date  . Tubal ligation    . Tonsillectomy     History reviewed. No pertinent family history. History  Substance Use Topics  . Smoking status: Former Games developer  . Smokeless tobacco: Never Used  . Alcohol Use: No     Comment: occasional   OB History    No data available     Review of Systems  10 systems reviewed and found to be negative, except as noted in the HPI.   Allergies  Review of patient's allergies indicates no known allergies.  Home Medications   Prior to Admission medications   Medication Sig Start Date End Date Taking? Authorizing Provider  albuterol (PROVENTIL HFA;VENTOLIN HFA) 108 (90 BASE) MCG/ACT inhaler Inhale into the lungs every 6 (six) hours as needed for wheezing or shortness of breath.    Historical Provider, MD  fluconazole (DIFLUCAN) 200 MG tablet Take 1 tablet (200 mg total) by mouth once.  04/14/15   Jakerria Kingbird, PA-C  metroNIDAZOLE (FLAGYL) 500 MG tablet Take 1 tablet (500 mg total) by mouth 2 (two) times daily. One po bid x 7 days 04/14/15   Joni Reining Tanara Turvey, PA-C  predniSONE (DELTASONE) 20 MG tablet Take 2 tablets (40 mg total) by mouth daily. 04/14/15   Callum Wolf, PA-C   BP 129/77 mmHg  Pulse 96  Temp(Src) 97.8 F (36.6 C) (Oral)  Resp 20  SpO2 100%  LMP 04/10/2015 Physical Exam  Constitutional: She is oriented to person, place, and time. She appears well-developed and well-nourished. No distress.  HENT:  Head: Normocephalic.  Mouth/Throat: Oropharynx is clear and moist.  Eyes: Conjunctivae and EOM are normal. Pupils are equal, round, and reactive to light.  Neck: Normal range of motion. No JVD present. No tracheal deviation present.  Cardiovascular: Normal rate, regular rhythm and intact distal pulses.   Radial pulse equal bilaterally  Pulmonary/Chest: Effort normal. No stridor. No respiratory distress. She has wheezes. She has no rales. She exhibits no tenderness.  Issues reclining comfortably, speaking in complete sentences, trace scattered expiratory wheezing.  Abdominal: Soft. Bowel sounds are normal. She exhibits no distension and no mass. There is no tenderness. There is no rebound and no guarding.  Genitourinary:  Pelvic exam a chaperoned by nurse: No rashes or lesions, patient has very thick, foul-smelling homogenous vaginal discharge with no cervical motion or adnexal tenderness.    Musculoskeletal: Normal range of motion. She exhibits no edema or tenderness.  No calf asymmetry, superficial  collaterals, palpable cords, edema, Homans sign negative bilaterally.    Neurological: She is alert and oriented to person, place, and time.  Skin: Skin is warm. She is not diaphoretic.  Psychiatric: She has a normal mood and affect.  Nursing note and vitals reviewed.   ED Course  Procedures (including critical care time) Labs Review Labs Reviewed  WET  PREP, GENITAL - Abnormal; Notable for the following:    Yeast Wet Prep HPF POC TOO NUMEROUS TO COUNT (*)    Clue Cells Wet Prep HPF POC MANY (*)    WBC, Wet Prep HPF POC MANY (*)    All other components within normal limits  PREGNANCY, URINE  RPR  HIV ANTIBODY (ROUTINE TESTING)  CBG MONITORING, ED  GC/CHLAMYDIA PROBE AMP ([place]) NOT AT [facility]    Imaging Review Dg Chest 2 View  04/14/2015   CLINICAL DATA:  One day history of shortness of breath and wheezing  EXAM: CHEST  2 VIEW  COMPARISON:  December 30, 2013  FINDINGS: Lungs are clear. The heart size and pulmonary vascularity are normal. No adenopathy. There is thoracolumbar dextroscoliosis.  IMPRESSION: No edema or consolidation.   Electronically Signed   By: Bretta Bang III M.D.   On: 04/14/2015 15:55     EKG Interpretation None      MDM   Final diagnoses:  SOB (shortness of breath)  Asthma exacerbation  Vaginal yeast infection  Bacterial vaginosis    Filed Vitals:   04/14/15 1515 04/14/15 1556 04/14/15 1618 04/14/15 1628  BP:   122/85 129/77  Pulse:   100 96  Temp:      TempSrc:      Resp:   18 20  SpO2: 100% 100% 100% 100%    Medications  albuterol (PROVENTIL HFA;VENTOLIN HFA) 108 (90 BASE) MCG/ACT inhaler 2 puff (not administered)  ipratropium-albuterol (DUONEB) 0.5-2.5 (3) MG/3ML nebulizer solution 3 mL (3 mLs Nebulization Given 04/14/15 1557)  fluconazole (DIFLUCAN) tablet 200 mg (not administered)  metroNIDAZOLE (FLAGYL) tablet 500 mg (not administered)  albuterol (PROVENTIL) (2.5 MG/3ML) 0.083% nebulizer solution 5 mg (5 mg Nebulization Given 04/14/15 1513)  predniSONE (DELTASONE) tablet 60 mg (60 mg Oral Given 04/14/15 1554)    Carrie Nguyen is a pleasant 34 y.o. female presenting with wheezing and asthma and abnormal vaginal discharge. No systemic signs of infection or respiratory distress. Significantly improved after DuoNeb and prednisone, chest x-ray without signs of infection. Vital signs remained  without abnormality.  Ulrine exam with thick, foul-smelling, homogenous white discharge no cervical motion or adnexal tenderness. Wet prep shows too numerous to count yeast and also many clue many white blood cells, without CMT or adnexal tenderness I'm doubting that this is a PID. Patient will be treated for yeast and bacterial vaginosis.  Evaluation does not show pathology that would require ongoing emergent intervention or inpatient treatment. Pt is hemodynamically stable and mentating appropriately. Discussed findings and plan with patient/guardian, who agrees with care plan. All questions answered. Return precautions discussed and outpatient follow up given.   New Prescriptions   FLUCONAZOLE (DIFLUCAN) 200 MG TABLET    Take 1 tablet (200 mg total) by mouth once.   METRONIDAZOLE (FLAGYL) 500 MG TABLET    Take 1 tablet (500 mg total) by mouth 2 (two) times daily. One po bid x 7 days   PREDNISONE (DELTASONE) 20 MG TABLET    Take 2 tablets (40 mg total) by mouth daily.        Wynetta Emery, PA-C 04/14/15  2130  Rolan Bucco, MD 04/17/15 1505

## 2015-04-15 LAB — GC/CHLAMYDIA PROBE AMP (~~LOC~~) NOT AT ARMC
CHLAMYDIA, DNA PROBE: NEGATIVE
Neisseria Gonorrhea: NEGATIVE

## 2015-04-16 LAB — HIV ANTIBODY (ROUTINE TESTING W REFLEX): HIV SCREEN 4TH GENERATION: NONREACTIVE

## 2015-04-16 LAB — RPR: RPR Ser Ql: NONREACTIVE

## 2015-05-03 ENCOUNTER — Emergency Department (HOSPITAL_BASED_OUTPATIENT_CLINIC_OR_DEPARTMENT_OTHER)
Admission: EM | Admit: 2015-05-03 | Discharge: 2015-05-03 | Disposition: A | Discharge status: Home / Self Care | Payer: 59 | Attending: Emergency Medicine | Admitting: Emergency Medicine

## 2015-05-03 ENCOUNTER — Encounter (HOSPITAL_BASED_OUTPATIENT_CLINIC_OR_DEPARTMENT_OTHER): Payer: Self-pay | Admitting: *Deleted

## 2015-05-03 DIAGNOSIS — K1379 Other lesions of oral mucosa: Secondary | ICD-10-CM | POA: Insufficient documentation

## 2015-05-03 DIAGNOSIS — Z79899 Other long term (current) drug therapy: Secondary | ICD-10-CM | POA: Diagnosis not present

## 2015-05-03 DIAGNOSIS — J45909 Unspecified asthma, uncomplicated: Secondary | ICD-10-CM | POA: Insufficient documentation

## 2015-05-03 DIAGNOSIS — Z87891 Personal history of nicotine dependence: Secondary | ICD-10-CM | POA: Insufficient documentation

## 2015-05-03 DIAGNOSIS — R220 Localized swelling, mass and lump, head: Secondary | ICD-10-CM | POA: Diagnosis present

## 2015-05-03 DIAGNOSIS — K122 Cellulitis and abscess of mouth: Secondary | ICD-10-CM

## 2015-05-03 DIAGNOSIS — Z7952 Long term (current) use of systemic steroids: Secondary | ICD-10-CM | POA: Insufficient documentation

## 2015-05-03 MED ORDER — PENICILLIN V POTASSIUM 500 MG PO TABS: 500.0000 mg | ORAL_TABLET | Freq: Four times a day (QID) | ORAL | Status: DC

## 2015-05-03 NOTE — ED Notes (Signed)
She woke with swelling to her upper lip. She raised her lip to look inside and saw a small puss filled pimple inside her lip that drained. It looks better but is still slightly swollen.

## 2015-05-03 NOTE — ED Provider Notes (Signed)
CSN: [id number]     Arrival date & time 05/03/15  1423 History   First MD Initiated Contact with Patient 05/03/15 1619     Chief Complaint  Patient presents with  . Oral Swelling     (Consider location/radiation/quality/duration/timing/severity/associated sxs/prior Treatment) HPI  Carrie Nguyen Is a 34 year old female who presents the emergency department with chief complaint oral swelling. Patient noticed swelling and pain in the upper portion of the mouth where her upper lip meets the gumline. She states that overnight her swelling and pain became worse. When she woke up she lifted up her upper lip to block it. The area and saw what appeared to be a pimple-like object at the superior labial frenulum. It immediately burst and drained bloody and purulent material. Patient denies swelling in the other parts of the lip, throat, tongue, or sublingual area. She denies wheezing, changes in voice. Never had anything like this before. She does not smoke. No history of her herpetic infections of the mouth. No history of aphthous ulcers. Past Medical History  Diagnosis Date  . Asthma    Past Surgical History  Procedure Laterality Date  . Tubal ligation    . Tonsillectomy     No family history on file. Social History  Substance Use Topics  . Smoking status: Former Games developer  . Smokeless tobacco: Never Used  . Alcohol Use: No     Comment: occasional   OB History    No data available     Review of Systems   Ten systems reviewed and are negative for acute change, except as noted in the HPI.   Allergies  Review of patient's allergies indicates no known allergies.  Home Medications   Prior to Admission medications   Medication Sig Start Date End Date Taking? Authorizing Provider  albuterol (PROVENTIL HFA;VENTOLIN HFA) 108 (90 BASE) MCG/ACT inhaler Inhale into the lungs every 6 (six) hours as needed for wheezing or shortness of breath.    Historical Provider, MD  fluconazole (DIFLUCAN) 200  MG tablet Take 1 tablet (200 mg total) by mouth once. 04/14/15   Nicole Pisciotta, PA-C  metroNIDAZOLE (FLAGYL) 500 MG tablet Take 1 tablet (500 mg total) by mouth 2 (two) times daily. One po bid x 7 days 04/14/15   Joni Reining Pisciotta, PA-C  predniSONE (DELTASONE) 20 MG tablet Take 2 tablets (40 mg total) by mouth daily. 04/14/15   Nicole Pisciotta, PA-C   BP 122/47 mmHg  Pulse 108  Temp(Src) 98.3 F (36.8 C) (Oral)  Resp 20  Ht 5\' 5"  (1.651 m)  Wt 185 lb (83.915 kg)  BMI 30.79 kg/m2  SpO2 100%  LMP 04/10/2015 Physical Exam Physical Exam  Nursing note and vitals reviewed. Constitutional: She is oriented to person, place, and time. She appears well-developed and well-nourished. No distress.  HENT:  Head: Normocephalic and atraumatic.  Eyes: Conjunctivae normal and EOM are normal. Pupils are equal, round, and reactive to light. No scleral icterus.  Mouth: 2 mm ulceration at the superior labial frenulum. No discharge, swelling or surrounding cellulitic changes noted. Neck: Normal range of motion. Cardiovascular: Normal rate, regular rhythm and normal heart sounds.  Exam reveals no gallop and no friction rub.   No murmur heard. Pulmonary/Chest: Effort normal and breath sounds normal. No respiratory distress.  Abdominal: Soft. Bowel sounds are normal. She exhibits no distension and no mass. There is no tenderness. There is no guarding.  Neurological: She is alert and oriented to person, place, and time.  Skin: Skin is warm  and dry. She is not diaphoretic.    ED Course  Procedures (including critical care time) Labs Review Labs Reviewed - No data to display  Imaging Review No results found. Lucila Maine, personally reviewed and evaluated these images and lab results as part of my medical decision-making.   EKG Interpretation None      MDM   Final diagnoses:  None    4:45 PM BP 122/47 mmHg  Pulse 108  Temp(Src) 98.3 F (36.8 C) (Oral)  Resp 20  Ht  (1.651 m)   Wt 185 lb (83.915 kg)  BMI 30.79 kg/m2  SpO2 100%  LMP 04/10/2015 Patient states that she was very nervous because her brother said it was an abscess and could kill her. She feels reassured. She states that her heart was racing. Because she was very nervous. Patient actively crying. I reassured the patient that this was probably a small infection in the gumline. It does not appear to be severe. No signs of herpetic infection. Airway is patent and without swelling. No wheezing on lung exam. Patient will be discharged with Keflex. Oral topical numbing agents for pain relief. Over-the-counter. Patient appears safe for discharge at this time.   Arthor Captain, PA-C 05/03/15 1648  Doug Sou, MD 05/03/15 [id number]

## 2015-05-03 NOTE — Discharge Instructions (Signed)

## 2015-06-17 ENCOUNTER — Emergency Department (HOSPITAL_BASED_OUTPATIENT_CLINIC_OR_DEPARTMENT_OTHER)
Admission: EM | Admit: 2015-06-17 | Discharge: 2015-06-17 | Disposition: A | Discharge status: Home / Self Care | Payer: 59 | Attending: Physician Assistant | Admitting: Physician Assistant

## 2015-06-17 ENCOUNTER — Emergency Department (HOSPITAL_BASED_OUTPATIENT_CLINIC_OR_DEPARTMENT_OTHER): Payer: 59

## 2015-06-17 ENCOUNTER — Encounter (HOSPITAL_BASED_OUTPATIENT_CLINIC_OR_DEPARTMENT_OTHER): Payer: Self-pay | Admitting: Emergency Medicine

## 2015-06-17 DIAGNOSIS — Z87891 Personal history of nicotine dependence: Secondary | ICD-10-CM | POA: Diagnosis not present

## 2015-06-17 DIAGNOSIS — J45901 Unspecified asthma with (acute) exacerbation: Secondary | ICD-10-CM | POA: Insufficient documentation

## 2015-06-17 DIAGNOSIS — R0602 Shortness of breath: Secondary | ICD-10-CM | POA: Diagnosis present

## 2015-06-17 DIAGNOSIS — D649 Anemia, unspecified: Secondary | ICD-10-CM | POA: Insufficient documentation

## 2015-06-17 DIAGNOSIS — Z79899 Other long term (current) drug therapy: Secondary | ICD-10-CM | POA: Diagnosis not present

## 2015-06-17 HISTORY — DX: Anemia, unspecified: D64.9

## 2015-06-17 MED ORDER — PREDNISONE 20 MG PO TABS: ORAL_TABLET | ORAL | Status: DC

## 2015-06-17 MED ORDER — IPRATROPIUM-ALBUTEROL 0.5-2.5 (3) MG/3ML IN SOLN
3.0000 mL | Freq: Once | RESPIRATORY_TRACT | Status: AC
  Administered 2015-06-17: 3 mL via RESPIRATORY_TRACT
  Filled 2015-06-17: qty 3

## 2015-06-17 MED ORDER — ALBUTEROL SULFATE HFA 108 (90 BASE) MCG/ACT IN AERS
2.0000 | INHALATION_SPRAY | Freq: Once | RESPIRATORY_TRACT | Status: AC
  Administered 2015-06-17: 2 via RESPIRATORY_TRACT
  Filled 2015-06-17: qty 6.7

## 2015-06-17 MED ORDER — ALBUTEROL SULFATE (2.5 MG/3ML) 0.083% IN NEBU
INHALATION_SOLUTION | RESPIRATORY_TRACT | Status: AC
  Administered 2015-06-17: 2.5 mg
  Filled 2015-06-17: qty 3

## 2015-06-17 MED ORDER — IPRATROPIUM-ALBUTEROL 0.5-2.5 (3) MG/3ML IN SOLN
RESPIRATORY_TRACT | Status: AC
  Administered 2015-06-17: 3 mL
  Filled 2015-06-17: qty 3

## 2015-06-17 MED ORDER — PREDNISONE 50 MG PO TABS
60.0000 mg | ORAL_TABLET | Freq: Once | ORAL | Status: AC
  Administered 2015-06-17: 60 mg via ORAL
  Filled 2015-06-17 (×2): qty 1

## 2015-06-17 NOTE — ED Notes (Signed)
Patient transported to x-ray. ?

## 2015-06-17 NOTE — ED Notes (Signed)
Sob recently.  Pt uses albuterol inhaler but no steroids or albuterol nebulizer machine.  Pt has expiratory wheezes throughout.

## 2015-06-17 NOTE — ED Provider Notes (Signed)
CSN: [id number]   Arrival date & time 06/17/15 1725  History  By signing my name below, I, Carrie Nguyen, attest that this documentation has been prepared under the direction and in the presence of Courteney Randall An, MD. Electronically Signed: Bethel Nguyen, ED Scribe. 06/17/2015. 6:30 PM.  Chief Complaint  Patient presents with  . Shortness of Breath    HPI The history is provided by the patient. No language interpreter was used.   Carrie Nguyen is a 34 y.o. female who presents to the Emergency Department complaining of an asthma exacerbation with onset 2 days ago. Her albuterol inhaler and her mother's home nebulizer have provided insufficient relief at home.  Associated symptoms include SOB. Over the last several days she has had some chest congestion and productive cough.  Pt denies fever or nasal discharge.   Past Medical History  Diagnosis Date  . Asthma   . Anemia     Past Surgical History  Procedure Laterality Date  . Tubal ligation    . Tonsillectomy      No family history on file.  Social History  Substance Use Topics  . Smoking status: Former Games developer  . Smokeless tobacco: Never Used  . Alcohol Use: No     Comment: occasional     Review of Systems  Constitutional: Negative for fever and chills.  Respiratory: Positive for cough.        Chest congestion  Gastrointestinal: Negative for nausea and vomiting.  Neurological: Negative for weakness.  All other systems reviewed and are negative.  Home Medications   Prior to Admission medications   Medication Sig Start Date End Date Taking? Authorizing Provider  ferrous sulfate 325 (65 FE) MG tablet Take 325 mg by mouth daily with breakfast.   Yes Historical Provider, MD  phentermine 30 MG capsule Take 30 mg by mouth every morning.   Yes Historical Provider, MD  albuterol (PROVENTIL HFA;VENTOLIN HFA) 108 (90 BASE) MCG/ACT inhaler Inhale into the lungs every 6 (six) hours as needed for wheezing or shortness of  breath.    Historical Provider, MD    Allergies  Review of patient's allergies indicates no known allergies.  Triage Vitals: BP 117/73 mmHg  Pulse 108  Temp(Src) 98.6 F (37 C) (Oral)  Resp 22  Ht  (1.651 m)  Wt 199 lb (90.266 kg)  BMI 33.12 kg/m2  SpO2 97%  LMP 06/03/2015  Physical Exam  Constitutional: She is oriented to person, place, and time. She appears well-developed and well-nourished. No distress.  HENT:  Head: Normocephalic and atraumatic.  Mouth/Throat: No oropharyngeal exudate, posterior oropharyngeal edema or posterior oropharyngeal erythema.  Normal turbinates  Eyes: EOM are normal.  Neck: Normal range of motion.  Cardiovascular: Normal rate, regular rhythm and normal heart sounds.   Pulmonary/Chest: Effort normal. She has wheezes (end expiratory).  Abdominal: Soft. She exhibits no distension. There is no tenderness.  Musculoskeletal: Normal range of motion.  Neurological: She is alert and oriented to person, place, and time.  Skin: Skin is warm and dry.  Psychiatric: She has a normal mood and affect. Judgment normal.  Nursing note and vitals reviewed.   ED Course  Procedures   DIAGNOSTIC STUDIES: Oxygen Saturation is 97% on RA, normal by my interpretation.    COORDINATION OF CARE: 6:30 PM Discussed treatment plan which includes CXR and breathing treatment with pt at bedside and pt agreed to plan.  Labs Reviewed - No data to display  Imaging Review No results found.  EKG  Interpretation  Date/Time:    Ventricular Rate:    PR Interval:    QRS Duration:   QT Interval:    QTC Calculation:   R Axis:     Text Interpretation:      MDM   Final diagnoses:  None    Patient is a pleasant 34 year old female presenting with asthma exacerbation. Patient has had this multiple times in the past. She has had minor runny nose and URI symptoms. We'll get chest x-ray to make sure she doesn't have a pneumonia. We'll treat with another DuoNeb and  prednisone and do prednisone taper.  I personally performed the services described in this documentation, which was scribed in my presence. The recorded information has been reviewed and is accurate.    Courteney Randall An, MD 06/17/15 1610

## 2015-06-17 NOTE — Discharge Instructions (Signed)

## 2015-07-11 ENCOUNTER — Emergency Department (HOSPITAL_BASED_OUTPATIENT_CLINIC_OR_DEPARTMENT_OTHER)
Admission: EM | Admit: 2015-07-11 | Discharge: 2015-07-11 | Disposition: A | Discharge status: Home / Self Care | Payer: 59 | Attending: Emergency Medicine | Admitting: Emergency Medicine

## 2015-07-11 ENCOUNTER — Encounter (HOSPITAL_BASED_OUTPATIENT_CLINIC_OR_DEPARTMENT_OTHER): Payer: Self-pay | Admitting: *Deleted

## 2015-07-11 DIAGNOSIS — J45901 Unspecified asthma with (acute) exacerbation: Secondary | ICD-10-CM | POA: Insufficient documentation

## 2015-07-11 DIAGNOSIS — D649 Anemia, unspecified: Secondary | ICD-10-CM | POA: Insufficient documentation

## 2015-07-11 DIAGNOSIS — Z79899 Other long term (current) drug therapy: Secondary | ICD-10-CM | POA: Insufficient documentation

## 2015-07-11 DIAGNOSIS — Z87891 Personal history of nicotine dependence: Secondary | ICD-10-CM | POA: Diagnosis not present

## 2015-07-11 DIAGNOSIS — R0602 Shortness of breath: Secondary | ICD-10-CM | POA: Diagnosis present

## 2015-07-11 MED ORDER — IPRATROPIUM-ALBUTEROL 0.5-2.5 (3) MG/3ML IN SOLN
3.0000 mL | Freq: Four times a day (QID) | RESPIRATORY_TRACT | Status: DC
  Administered 2015-07-11: 3 mL via RESPIRATORY_TRACT
  Filled 2015-07-11: qty 3

## 2015-07-11 MED ORDER — HYDROXYZINE HCL 25 MG PO TABS
25.0000 mg | ORAL_TABLET | Freq: Once | ORAL | Status: AC
  Administered 2015-07-11: 25 mg via ORAL
  Filled 2015-07-11: qty 1

## 2015-07-11 MED ORDER — IPRATROPIUM-ALBUTEROL 0.5-2.5 (3) MG/3ML IN SOLN
3.0000 mL | Freq: Once | RESPIRATORY_TRACT | Status: AC
Start: 2015-07-11 — End: 2015-07-11
  Administered 2015-07-11: 3 mL via RESPIRATORY_TRACT
  Filled 2015-07-11: qty 3

## 2015-07-11 MED ORDER — ALBUTEROL SULFATE (2.5 MG/3ML) 0.083% IN NEBU
2.5000 mg | INHALATION_SOLUTION | Freq: Once | RESPIRATORY_TRACT | Status: AC
  Administered 2015-07-11: 2.5 mg via RESPIRATORY_TRACT
  Filled 2015-07-11: qty 3

## 2015-07-11 MED ORDER — METHYLPREDNISOLONE SODIUM SUCC 125 MG IJ SOLR
125.0000 mg | Freq: Once | INTRAMUSCULAR | Status: AC
Start: 2015-07-11 — End: 2015-07-11
  Administered 2015-07-11: 125 mg via INTRAMUSCULAR
  Filled 2015-07-11: qty 2

## 2015-07-11 MED ORDER — HYDROXYZINE HCL 25 MG PO TABS: 25.0000 mg | ORAL_TABLET | Freq: Four times a day (QID) | ORAL | Status: DC

## 2015-07-11 MED ORDER — PREDNISONE 10 MG PO TABS: 20.0000 mg | ORAL_TABLET | Freq: Two times a day (BID) | ORAL | Status: DC

## 2015-07-11 NOTE — ED Provider Notes (Signed)
CSN: [id number]     Arrival date & time 07/11/15  2129 History  By signing my name below, I, Gwenyth Oberatherine Macek, attest that this documentation has been prepared under the direction and in the presence of Geoffery Lyonsouglas Karsynn Deweese, MD.  Electronically Signed: Gwenyth Oberatherine Macek, ED Scribe. 07/11/2015. 10:17 PM.   Chief Complaint  Patient presents with  . Shortness of Breath   Patient is a 34 y.o. female presenting with shortness of breath. The history is provided by the patient. No language interpreter was used.  Shortness of Breath Severity:  Moderate Duration:  1 hour Timing:  Constant Progression:  Unchanged Chronicity:  Recurrent Relieved by:  Nothing Worsened by:  Nothing tried Ineffective treatments:  Inhaler Associated symptoms: no chest pain     HPI Comments: Carrie Nguyen is a 34 y.o. female with a history of asthma who presents to the Emergency Department complaining of constant, moderate SOB that started 1 hour ago. She has tried her at-home inhaler with no relief. Pt suspects a dusty couch may trigger her symptoms. Pt was last seen in the ED on 9/26 for an asthma exacerbation and was prescribed Prednisone, which relieved her symptoms. She does not have an at-home nebulizer. Pt denies smoking. She also denies CP.   Past Medical History  Diagnosis Date  . Asthma   . Anemia    Past Surgical History  Procedure Laterality Date  . Tubal ligation    . Tonsillectomy     No family history on file. Social History  Substance Use Topics  . Smoking status: Former Games developermoker  . Smokeless tobacco: Never Used  . Alcohol Use: No     Comment: occasional   OB History    No data available     Review of Systems  Respiratory: Positive for shortness of breath.   Cardiovascular: Negative for chest pain.  All other systems reviewed and are negative.     Allergies  Review of patient's allergies indicates no known allergies.  Home Medications   Prior to Admission medications   Medication Sig Start  Date End Date Taking? Authorizing Provider  albuterol (PROVENTIL HFA;VENTOLIN HFA) 108 (90 BASE) MCG/ACT inhaler Inhale into the lungs every 6 (six) hours as needed for wheezing or shortness of breath.    Historical Provider, MD  ferrous sulfate 325 (65 FE) MG tablet Take 325 mg by mouth daily with breakfast.    Historical Provider, MD  phentermine 30 MG capsule Take 30 mg by mouth every morning.    Historical Provider, MD  predniSONE (DELTASONE) 20 MG tablet Day 1 and 2: Take 3 tabs  Day 3-5: Take 2 tabs.  Day 5-8: take 1 tab 06/17/15   Courteney Lyn Mackuen, MD   BP 116/62 mmHg  Pulse 100  Temp(Src) 98.6 F (37 C) (Oral)  Resp 18  Ht 5\' 5"  (1.651 m)  Wt 199 lb (90.266 kg)  BMI 33.12 kg/m2  SpO2 94%  LMP 06/03/2015 Physical Exam  Constitutional: She is oriented to person, place, and time. She appears well-developed and well-nourished. No distress.  HENT:  Head: Normocephalic and atraumatic.  Eyes: Conjunctivae and EOM are normal.  Neck: Neck supple. No tracheal deviation present.  Cardiovascular: Normal rate, regular rhythm and normal heart sounds.   Pulmonary/Chest: She is in respiratory distress (Mild, but is able to finish sentences).  Bilateral expiratory wheezing present  Abdominal: Soft. There is no tenderness.  Neurological: She is alert and oriented to person, place, and time.  Skin: Skin is warm and  dry.  Psychiatric: She has a normal mood and affect. Her behavior is normal.  Nursing note and vitals reviewed.   ED Course  Procedures  DIAGNOSTIC STUDIES: Oxygen Saturation is 94% on RA, normal by my interpretation.    COORDINATION OF CARE: 10:17 PM Discussed treatment plan with pt which includes a breathing treatment and Solu-medrol in the ED. Pt agreed to plan.  Labs Review Labs Reviewed - No data to display  Imaging Review No results found.    EKG Interpretation None      MDM   Final diagnoses:  None    Patient with a history of asthma. She presents  with wheezing which has improved significantly after 2 breathing treatments and an IM dose of Solu-Medrol. She has frequent flareups of asthma and I suspect this is another. She has no fever and no productive cough. She will be started on prednisone, prescribed Atarax for her itching, and advised to follow-up with her primary Dr. if not improving. Oxygen saturations are 100% at the time of discharge and she is in no respiratory distress.  Roney Jaffe, personally performed the services described in this documentation. All medical record entries made by the scribe were at my direction and in my presence.  I have reviewed the chart and discharge instructions and agree that the record reflects my personal performance and is accurate and complete. Geoffery Lyons.  07/11/2015. 11:02 PM.       Geoffery Lyons, MD 07/11/15 2302

## 2015-07-11 NOTE — ED Notes (Signed)
Sob for an hour. Sudden onset.

## 2015-07-11 NOTE — ED Notes (Signed)
Pt verbalizes understanding of d/c instructions and denies any further needs at this time. 

## 2015-07-11 NOTE — Discharge Instructions (Signed)
Prednisone as prescribed.  Continue your albuterol treatments every 4 hours as needed.  Atarax as prescribed as needed for itching.  Return to the ER if symptoms significantly worsen or change.   Asthma, Adult Asthma is a recurring condition in which the airways tighten and narrow. Asthma can make it difficult to breathe. It can cause coughing, wheezing, and shortness of breath. Asthma episodes, also called asthma attacks, range from minor to life-threatening. Asthma cannot be cured, but medicines and lifestyle changes can help control it. CAUSES Asthma is believed to be caused by inherited (genetic) and environmental factors, but its exact cause is unknown. Asthma may be triggered by allergens, lung infections, or irritants in the air. Asthma triggers are different for each person. Common triggers include:   Animal dander.  Dust mites.  Cockroaches.  Pollen from trees or grass.  Mold.  Smoke.  Air pollutants such as dust, household cleaners, hair sprays, aerosol sprays, paint fumes, strong chemicals, or strong odors.  Cold air, weather changes, and winds (which increase molds and pollens in the air).  Strong emotional expressions such as crying or laughing hard.  Stress.  Certain medicines (such as aspirin) or types of drugs (such as beta-blockers).  Sulfites in foods and drinks. Foods and drinks that may contain sulfites include dried fruit, potato chips, and sparkling grape juice.  Infections or inflammatory conditions such as the flu, a cold, or an inflammation of the nasal membranes (rhinitis).  Gastroesophageal reflux disease (GERD).  Exercise or strenuous activity. SYMPTOMS Symptoms may occur immediately after asthma is triggered or many hours later. Symptoms include:  Wheezing.  Excessive nighttime or early morning coughing.  Frequent or severe coughing with a common cold.  Chest tightness.  Shortness of breath. DIAGNOSIS  The diagnosis of asthma is  made by a review of your medical history and a physical exam. Tests may also be performed. These may include:  Lung function studies. These tests show how much air you breathe in and out.  Allergy tests.  Imaging tests such as X-rays. TREATMENT  Asthma cannot be cured, but it can usually be controlled. Treatment involves identifying and avoiding your asthma triggers. It also involves medicines. There are 2 classes of medicine used for asthma treatment:   Controller medicines. These prevent asthma symptoms from occurring. They are usually taken every day.  Reliever or rescue medicines. These quickly relieve asthma symptoms. They are used as needed and provide short-term relief. Your health care provider will help you create an asthma action plan. An asthma action plan is a written plan for managing and treating your asthma attacks. It includes a list of your asthma triggers and how they may be avoided. It also includes information on when medicines should be taken and when their dosage should be changed. An action plan may also involve the use of a device called a peak flow meter. A peak flow meter measures how well the lungs are working. It helps you monitor your condition. HOME CARE INSTRUCTIONS   Take medicines only as directed by your health care provider. Speak with your health care provider if you have questions about how or when to take the medicines.  Use a peak flow meter as directed by your health care provider. Record and keep track of readings.  Understand and use the action plan to help minimize or stop an asthma attack without needing to seek medical care.  Control your home environment in the following ways to help prevent asthma attacks:  Do  not smoke. Avoid being exposed to secondhand smoke.  Change your heating and air conditioning filter regularly.  Limit your use of fireplaces and wood stoves.  Get rid of pests (such as roaches and mice) and their droppings.  Throw  away plants if you see mold on them.  Clean your floors and dust regularly. Use unscented cleaning products.  Try to have someone else vacuum for you regularly. Stay out of rooms while they are being vacuumed and for a short while afterward. If you vacuum, use a dust mask from a hardware store, a double-layered or microfilter vacuum cleaner bag, or a vacuum cleaner with a HEPA filter.  Replace carpet with wood, tile, or vinyl flooring. Carpet can trap dander and dust.  Use allergy-proof pillows, mattress covers, and box spring covers.  Wash bed sheets and blankets every week in hot water and dry them in a dryer.  Use blankets that are made of polyester or cotton.  Clean bathrooms and kitchens with bleach. If possible, have someone repaint the walls in these rooms with mold-resistant paint. Keep out of the rooms that are being cleaned and painted.  Wash hands frequently. SEEK MEDICAL CARE IF:   You have wheezing, shortness of breath, or a cough even if taking medicine to prevent attacks.  The colored mucus you cough up (sputum) is thicker than usual.  Your sputum changes from clear or white to yellow, green, gray, or bloody.  You have any problems that may be related to the medicines you are taking (such as a rash, itching, swelling, or trouble breathing).  You are using a reliever medicine more than 2-3 times per week.  Your peak flow is still at 50-79% of your personal best after following your action plan for 1 hour.  You have a fever. SEEK IMMEDIATE MEDICAL CARE IF:   You seem to be getting worse and are unresponsive to treatment during an asthma attack.  You are short of breath even at rest.  You get short of breath when doing very little physical activity.  You have difficulty eating, drinking, or talking due to asthma symptoms.  You develop chest pain.  You develop a fast heartbeat.  You have a bluish color to your lips or fingernails.  You are light-headed,  dizzy, or faint.  Your peak flow is less than 50% of your personal best.   This information is not intended to replace advice given to you by your health care provider. Make sure you discuss any questions you have with your health care provider.   Document Released: 09/07/2005 Document Revised: 05/29/2015 Document Reviewed: 04/06/2013 Elsevier Interactive Patient Education [employer]

## 2015-07-11 NOTE — ED Notes (Signed)
Pt states she has been having asthma complications for an hour and a half prior to arrival and attributes it to an old couch in her house.  She has had multiple visits for asthma complications, no PCP and states that she uses her inhaler at home and "it doesn't do anything."  Encouraged pt to find a PCP to manage her asthma symptoms, get rid of her old couch and reduce her contact with allergens.

## 2015-12-20 ENCOUNTER — Emergency Department (HOSPITAL_BASED_OUTPATIENT_CLINIC_OR_DEPARTMENT_OTHER)
Admission: EM | Admit: 2015-12-20 | Discharge: 2015-12-20 | Disposition: A | Discharge status: Home / Self Care | Payer: 59 | Attending: Emergency Medicine | Admitting: Emergency Medicine

## 2015-12-20 ENCOUNTER — Encounter (HOSPITAL_BASED_OUTPATIENT_CLINIC_OR_DEPARTMENT_OTHER): Payer: Self-pay | Admitting: *Deleted

## 2015-12-20 DIAGNOSIS — Z79899 Other long term (current) drug therapy: Secondary | ICD-10-CM | POA: Insufficient documentation

## 2015-12-20 DIAGNOSIS — J45901 Unspecified asthma with (acute) exacerbation: Secondary | ICD-10-CM | POA: Diagnosis not present

## 2015-12-20 DIAGNOSIS — R000 Tachycardia, unspecified: Secondary | ICD-10-CM | POA: Diagnosis not present

## 2015-12-20 DIAGNOSIS — Z87891 Personal history of nicotine dependence: Secondary | ICD-10-CM | POA: Diagnosis not present

## 2015-12-20 DIAGNOSIS — J45909 Unspecified asthma, uncomplicated: Secondary | ICD-10-CM | POA: Diagnosis present

## 2015-12-20 DIAGNOSIS — D649 Anemia, unspecified: Secondary | ICD-10-CM | POA: Insufficient documentation

## 2015-12-20 MED ORDER — PREDNISONE 50 MG PO TABS
60.0000 mg | ORAL_TABLET | Freq: Once | ORAL | Status: AC
  Administered 2015-12-20: 60 mg via ORAL
  Filled 2015-12-20: qty 1

## 2015-12-20 MED ORDER — IPRATROPIUM-ALBUTEROL 0.5-2.5 (3) MG/3ML IN SOLN
RESPIRATORY_TRACT | Status: AC
  Administered 2015-12-20: 3 mL
  Filled 2015-12-20: qty 3

## 2015-12-20 MED ORDER — PREDNISONE 10 MG PO TABS: 40.0000 mg | ORAL_TABLET | Freq: Every day | ORAL | Status: DC

## 2015-12-20 MED ORDER — ALBUTEROL (5 MG/ML) CONTINUOUS INHALATION SOLN
INHALATION_SOLUTION | RESPIRATORY_TRACT | Status: AC
  Administered 2015-12-20: 15 mg/h
  Filled 2015-12-20: qty 20

## 2015-12-20 MED ORDER — ALBUTEROL SULFATE (2.5 MG/3ML) 0.083% IN NEBU
INHALATION_SOLUTION | RESPIRATORY_TRACT | Status: AC
  Administered 2015-12-20: 2.5 mg
  Filled 2015-12-20: qty 3

## 2015-12-20 MED FILL — predniSONE 10 MG TABS: 10 | 5 days supply | Qty: 20 | Fill #0

## 2015-12-20 NOTE — Discharge Instructions (Signed)
Asthma, Adult Asthma is a condition of the lungs in which the airways tighten and narrow. Asthma can make it hard to breathe. Asthma cannot be cured, but medicine and lifestyle changes can help control it. Asthma may be started (triggered) by:  Animal skin flakes (dander).  Dust.  Cockroaches.  Pollen.  Mold.  Smoke.  Cleaning products.  Hair sprays or aerosol sprays.  Paint fumes or strong smells.  Cold air, weather changes, and winds.  Crying or laughing hard.  Stress.  Certain medicines or drugs.  Foods, such as dried fruit, potato chips, and sparkling grape juice.  Infections or conditions (colds, flu).  Exercise.  Certain medical conditions or diseases.  Exercise or tiring activities. HOME CARE   Take medicine as told by your doctor.  Use a peak flow meter as told by your doctor. A peak flow meter is a tool that measures how well the lungs are working.  Record and keep track of the peak flow meter's readings.  Understand and use the asthma action plan. An asthma action plan is a written plan for taking care of your asthma and treating your attacks.  To help prevent asthma attacks:  Do not smoke. Stay away from secondhand smoke.  Change your heating and air conditioning filter often.  Limit your use of fireplaces and wood stoves.  Get rid of pests (such as roaches and mice) and their droppings.  Throw away plants if you see mold on them.  Clean your floors. Dust regularly. Use cleaning products that do not smell.  Have someone vacuum when you are not home. Use a vacuum cleaner with a HEPA filter if possible.  Replace carpet with wood, tile, or vinyl flooring. Carpet can trap animal skin flakes and dust.  Use allergy-proof pillows, mattress covers, and box spring covers.  Wash bed sheets and blankets every week in hot water and dry them in a dryer.  Use blankets that are made of polyester or cotton.  Clean bathrooms and kitchens with bleach.  If possible, have someone repaint the walls in these rooms with mold-resistant paint. Keep out of the rooms that are being cleaned and painted.  Wash hands often. GET HELP IF:  You have make a whistling sound when breaking (wheeze), have shortness of breath, or have a cough even if taking medicine to prevent attacks.  The colored mucus you cough up (sputum) is thicker than usual.  The colored mucus you cough up changes from clear or white to yellow, green, gray, or bloody.  You have problems from the medicine you are taking such as:  A rash.  Itching.  Swelling.  Trouble breathing.  You need reliever medicines more than 2-3 times a week.  Your peak flow measurement is still at 50-79% of your personal best after following the action plan for 1 hour.  You have a fever. GET HELP RIGHT AWAY IF:   You seem to be worse and are not responding to medicine during an asthma attack.  You are short of breath even at rest.  You get short of breath when doing very little activity.  You have trouble eating, drinking, or talking.  You have chest pain.  You have a fast heartbeat.  Your lips or fingernails start to turn blue.  You are light-headed, dizzy, or faint.  Your peak flow is less than 50% of your personal best.   This information is not intended to replace advice given to you by your health care provider. Make sure  you discuss any questions you have with your health care provider.   Usual albuterol inhaler 2 puffs every 6 hours for the next 7 days. Take the prednisone as directed starting tomorrow. Adjuvant first dose of prednisone was given today already. Return for any new or worse symptoms.   Document Released: 02/24/2008 Document Revised: 05/29/2015 Document Reviewed: 04/06/2013 Elsevier Interactive Patient Education [employer]2016 Elsevier Inc.

## 2015-12-20 NOTE — ED Notes (Signed)
Inspiratory and expiratory Wheezing noted bilaterally.  Pt speaking in broken sentences.  Denies pain.  Forgot inhaler at home today while at work.

## 2015-12-20 NOTE — ED Provider Notes (Signed)
CSN: [id number]     Arrival date & time 12/20/15  1332 History   First MD Initiated Contact with Patient 12/20/15 1408     Chief Complaint  Patient presents with  . Asthma     (Consider location/radiation/quality/duration/timing/severity/associated sxs/prior Treatment) Patient is a 35 y.o. female presenting with asthma. The history is provided by the patient.  Asthma Associated symptoms include shortness of breath. Pertinent negatives include no abdominal pain and no headaches.  A patient with a known history of asthma. Patient was at work and had an acute exacerbation of her wheezing. Patient did not have her albuterol inhaler with her. Patient has not had any upper respiratory infection or feeling ill prior to this event. Patient does suffer from seasonal allergies. Patient brought herself here for breathing treatments. Patient denies any other concerns or complaints.  Past Medical History  Diagnosis Date  . Asthma   . Anemia    Past Surgical History  Procedure Laterality Date  . Tubal ligation    . Tonsillectomy     History reviewed. No pertinent family history. Social History  Substance Use Topics  . Smoking status: Former Games developermoker  . Smokeless tobacco: Never Used  . Alcohol Use: No     Comment: occasional   OB History    No data available     Review of Systems  Constitutional: Negative for fever.  HENT: Negative for congestion.   Eyes: Negative for visual disturbance.  Respiratory: Positive for shortness of breath and wheezing.   Gastrointestinal: Negative.  Negative for abdominal pain.  Genitourinary: Negative for dysuria.  Musculoskeletal: Negative for back pain.  Skin: Negative for rash.  Neurological: Negative for headaches.  Hematological: Does not bruise/bleed easily.  Psychiatric/Behavioral: Negative for confusion.      Allergies  Review of patient's allergies indicates no known allergies.  Home Medications   Prior to Admission medications    Medication Sig Start Date End Date Taking? Authorizing Provider  albuterol (PROVENTIL HFA;VENTOLIN HFA) 108 (90 BASE) MCG/ACT inhaler Inhale into the lungs every 6 (six) hours as needed for wheezing or shortness of breath.    Historical Provider, MD  ferrous sulfate 325 (65 FE) MG tablet Take 325 mg by mouth daily with breakfast.    Historical Provider, MD  predniSONE (DELTASONE) 10 MG tablet Take 4 tablets (40 mg total) by mouth daily. 12/20/15   Vanetta MuldersScott Iviana Blasingame, MD   BP 120/93 mmHg  Pulse 115  Temp(Src) 98.1 F (36.7 C) (Oral)  Resp   Ht 5\' 5"  (1.651 m)  Wt 93.486 kg  BMI 34.30 kg/m2  SpO2 93% Physical Exam  Constitutional: She is oriented to person, place, and time. She appears well-developed and well-nourished. No distress.  HENT:  Head: Normocephalic and atraumatic.  Mouth/Throat: Oropharynx is clear and moist.  Eyes: Conjunctivae and EOM are normal. Pupils are equal, round, and reactive to light.  Neck: Normal range of motion. Neck supple.  Cardiovascular: Normal rate, regular rhythm and normal heart sounds.   No murmur heard. Except slightly tachycardic.  Pulmonary/Chest: She is in respiratory distress. She has wheezes.  Abdominal: Soft. Bowel sounds are normal. There is no tenderness.  Musculoskeletal: Normal range of motion. She exhibits no edema.  Neurological: She is alert and oriented to person, place, and time. No cranial nerve deficit. She exhibits normal muscle tone. Coordination normal.  Skin: Skin is warm. No rash noted.  Nursing note and vitals reviewed.   ED Course  Procedures (including critical care time) Labs Review Labs  Reviewed - No data to display  Imaging Review No results found. I have personally reviewed and evaluated these images and lab results as part of my medical decision-making.   EKG Interpretation None      MDM   Final diagnoses:  Asthma exacerbation     patient has a history of asthma. Patient was at work and an acute  exacerbation wheezing. Did not have albuterol inhaler with her. It was at home. Patient also has trouble with seasonal allergies.  Patient with significant wheezing upon arrival. Patient was started on an hour-long nebulizer. Wheezing not completely resolved but significantly reduced and patient feels much better. She wants to go home. Patient given 60 mg of prednisone here orally will be continued on with prednisone for the next 5 days. Does have albuterol at home. Patient stable for discharge home. Patient's oxygen saturation 7 been fine they been in the mid 90s.   Vanetta Mulders, MD 12/20/15 [phone]

## 2016-02-13 ENCOUNTER — Emergency Department (HOSPITAL_BASED_OUTPATIENT_CLINIC_OR_DEPARTMENT_OTHER)
Admission: EM | Admit: 2016-02-13 | Discharge: 2016-02-13 | Disposition: A | Discharge status: Home / Self Care | Payer: 59 | Attending: Emergency Medicine | Admitting: Emergency Medicine

## 2016-02-13 ENCOUNTER — Encounter (HOSPITAL_BASED_OUTPATIENT_CLINIC_OR_DEPARTMENT_OTHER): Payer: Self-pay

## 2016-02-13 DIAGNOSIS — J45901 Unspecified asthma with (acute) exacerbation: Secondary | ICD-10-CM | POA: Diagnosis not present

## 2016-02-13 DIAGNOSIS — Z87891 Personal history of nicotine dependence: Secondary | ICD-10-CM | POA: Insufficient documentation

## 2016-02-13 DIAGNOSIS — R0602 Shortness of breath: Secondary | ICD-10-CM | POA: Diagnosis present

## 2016-02-13 MED ORDER — ALBUTEROL SULFATE (2.5 MG/3ML) 0.083% IN NEBU
2.5000 mg | INHALATION_SOLUTION | Freq: Once | RESPIRATORY_TRACT | Status: AC
  Administered 2016-02-13: 2.5 mg via RESPIRATORY_TRACT
  Filled 2016-02-13: qty 3

## 2016-02-13 MED ORDER — ALBUTEROL (5 MG/ML) CONTINUOUS INHALATION SOLN
10.0000 mg/h | INHALATION_SOLUTION | RESPIRATORY_TRACT | Status: DC
  Administered 2016-02-13: 10 mg/h via RESPIRATORY_TRACT
  Filled 2016-02-13 (×2): qty 20

## 2016-02-13 MED ORDER — IPRATROPIUM BROMIDE 0.02 % IN SOLN
0.5000 mg | Freq: Once | RESPIRATORY_TRACT | Status: AC
  Administered 2016-02-13: 0.5 mg via RESPIRATORY_TRACT
  Filled 2016-02-13: qty 2.5

## 2016-02-13 MED ORDER — PREDNISONE 10 MG PO TABS: 40.0000 mg | ORAL_TABLET | Freq: Every day | ORAL | Status: DC

## 2016-02-13 MED ORDER — IPRATROPIUM-ALBUTEROL 0.5-2.5 (3) MG/3ML IN SOLN
3.0000 mL | Freq: Once | RESPIRATORY_TRACT | Status: AC
  Administered 2016-02-13: 3 mL via RESPIRATORY_TRACT
  Filled 2016-02-13: qty 3

## 2016-02-13 MED ORDER — METHYLPREDNISOLONE SODIUM SUCC 125 MG IJ SOLR
125.0000 mg | Freq: Once | INTRAMUSCULAR | Status: AC
  Administered 2016-02-13: 125 mg via INTRAVENOUS
  Filled 2016-02-13: qty 2

## 2016-02-13 MED ORDER — ALBUTEROL SULFATE HFA 108 (90 BASE) MCG/ACT IN AERS: 1.0000 | INHALATION_SPRAY | Freq: Four times a day (QID) | RESPIRATORY_TRACT | Status: DC | PRN

## 2016-02-13 MED FILL — predniSONE 10 MG TABS: 10 | 5 days supply | Qty: 20 | Fill #0

## 2016-02-13 MED FILL — PROAIR HFA 90 MCG INHALER: 108 (90 BAS | 30 days supply | Qty: 9 | Fill #0

## 2016-02-13 NOTE — ED Provider Notes (Signed)
Patient rechecked after the continuous nebulizer with albuterol. Patient with no further wheezing. Patient ambulating fine. Patient feeling much better. Discharge home with albuterol inhaler and prednisone course.  Vanetta MuldersScott Tacora Athanas, MD 02/13/16 [phone]

## 2016-02-13 NOTE — Discharge Instructions (Signed)
Asthma, Adult Asthma is a recurring condition in which the airways tighten and narrow. Asthma can make it difficult to breathe. It can cause coughing, wheezing, and shortness of breath. Asthma episodes, also called asthma attacks, range from minor to life-threatening. Asthma cannot be cured, but medicines and lifestyle changes can help control it. CAUSES Asthma is believed to be caused by inherited (genetic) and environmental factors, but its exact cause is unknown. Asthma may be triggered by allergens, lung infections, or irritants in the air. Asthma triggers are different for each person. Common triggers include:   Animal dander.  Dust mites.  Cockroaches.  Pollen from trees or grass.  Mold.  Smoke.  Air pollutants such as dust, household cleaners, hair sprays, aerosol sprays, paint fumes, strong chemicals, or strong odors.  Cold air, weather changes, and winds (which increase molds and pollens in the air).  Strong emotional expressions such as crying or laughing hard.  Stress.  Certain medicines (such as aspirin) or types of drugs (such as beta-blockers).  Sulfites in foods and drinks. Foods and drinks that may contain sulfites include dried fruit, potato chips, and sparkling grape juice.  Infections or inflammatory conditions such as the flu, a cold, or an inflammation of the nasal membranes (rhinitis).  Gastroesophageal reflux disease (GERD).  Exercise or strenuous activity. SYMPTOMS Symptoms may occur immediately after asthma is triggered or many hours later. Symptoms include:  Wheezing.  Excessive nighttime or early morning coughing.  Frequent or severe coughing with a common cold.  Chest tightness.  Shortness of breath. DIAGNOSIS  The diagnosis of asthma is made by a review of your medical history and a physical exam. Tests may also be performed. These may include:  Lung function studies. These tests show how much air you breathe in and out.  Allergy  tests.  Imaging tests such as X-rays. TREATMENT  Asthma cannot be cured, but it can usually be controlled. Treatment involves identifying and avoiding your asthma triggers. It also involves medicines. There are 2 classes of medicine used for asthma treatment:   Controller medicines. These prevent asthma symptoms from occurring. They are usually taken every day.  Reliever or rescue medicines. These quickly relieve asthma symptoms. They are used as needed and provide short-term relief. Your health care provider will help you create an asthma action plan. An asthma action plan is a written plan for managing and treating your asthma attacks. It includes a list of your asthma triggers and how they may be avoided. It also includes information on when medicines should be taken and when their dosage should be changed. An action plan may also involve the use of a device called a peak flow meter. A peak flow meter measures how well the lungs are working. It helps you monitor your condition. HOME CARE INSTRUCTIONS   Take medicines only as directed by your health care provider. Speak with your health care provider if you have questions about how or when to take the medicines.  Use a peak flow meter as directed by your health care provider. Record and keep track of readings.  Understand and use the action plan to help minimize or stop an asthma attack without needing to seek medical care.  Control your home environment in the following ways to help prevent asthma attacks:  Do not smoke. Avoid being exposed to secondhand smoke.  Change your heating and air conditioning filter regularly.  Limit your use of fireplaces and wood stoves.  Get rid of pests (such as roaches  and mice) and their droppings.  Throw away plants if you see mold on them.  Clean your floors and dust regularly. Use unscented cleaning products.  Try to have someone else vacuum for you regularly. Stay out of rooms while they are  being vacuumed and for a short while afterward. If you vacuum, use a dust mask from a hardware store, a double-layered or microfilter vacuum cleaner bag, or a vacuum cleaner with a HEPA filter.  Replace carpet with wood, tile, or vinyl flooring. Carpet can trap dander and dust.  Use allergy-proof pillows, mattress covers, and box spring covers.  Wash bed sheets and blankets every week in hot water and dry them in a dryer.  Use blankets that are made of polyester or cotton.  Clean bathrooms and kitchens with bleach. If possible, have someone repaint the walls in these rooms with mold-resistant paint. Keep out of the rooms that are being cleaned and painted.  Wash hands frequently. SEEK MEDICAL CARE IF:   You have wheezing, shortness of breath, or a cough even if taking medicine to prevent attacks.  The colored mucus you cough up (sputum) is thicker than usual.  Your sputum changes from clear or white to yellow, green, gray, or bloody.  You have any problems that may be related to the medicines you are taking (such as a rash, itching, swelling, or trouble breathing).  You are using a reliever medicine more than 2-3 times per week.  Your peak flow is still at 50-79% of your personal best after following your action plan for 1 hour.  You have a fever. SEEK IMMEDIATE MEDICAL CARE IF:   You seem to be getting worse and are unresponsive to treatment during an asthma attack.  You are short of breath even at rest.  You get short of breath when doing very little physical activity.  You have difficulty eating, drinking, or talking due to asthma symptoms.  You develop chest pain.  You develop a fast heartbeat.  You have a bluish color to your lips or fingernails.  You are light-headed, dizzy, or faint.  Your peak flow is less than 50% of your personal best.   This information is not intended to replace advice given to you by your health care provider. Make sure you discuss any  questions you have with your health care provider.  Take the prednisone as directed for the next 5 days. Use albuterol inhaler 2 puffs every 6 hours for the next 7 days then as needed. Return for any new or worse symptoms.   Document Released: 09/07/2005 Document Revised: 05/29/2015 Document Reviewed: 04/06/2013 Elsevier Interactive Patient Education [employer] Inc.

## 2016-02-13 NOTE — ED Provider Notes (Signed)
CSN: [id number]     Arrival date & time 02/13/16  1311 History   None    Chief Complaint  Patient presents with  . Shortness of Breath    The history is provided by the patient. No language interpreter was used.   Carrie Nguyen is a 35 y.o. female who presents to the Emergency Department complaining of sob.  She has a history of asthma that is often worse when the weather changes. Over the last 3-4 days she's had increased cough and shortness of breath.She has associated nonproductive cough. She uses an albuterol inhaler as needed for her asthma but ran out of it last night. She denies any fevers, chest pain, abdominal pain, leg swelling or pain. Symptoms are moderate, constant, worsening.  Past Medical History  Diagnosis Date  . Asthma   . Anemia    Past Surgical History  Procedure Laterality Date  . Tubal ligation    . Tonsillectomy     No family history on file. Social History  Substance Use Topics  . Smoking status: Former Games developer  . Smokeless tobacco: Never Used  . Alcohol Use: No     Comment: occasional   OB History    No data available     Review of Systems  All other systems reviewed and are negative.     Allergies  Review of patient's allergies indicates no known allergies.  Home Medications   Prior to Admission medications   Medication Sig Start Date End Date Taking? Authorizing Provider  vitamin B-12 (CYANOCOBALAMIN) 100 MCG tablet Take 100 mcg by mouth daily.   Yes Historical Provider, MD  albuterol (PROVENTIL HFA;VENTOLIN HFA) 108 (90 BASE) MCG/ACT inhaler Inhale into the lungs every 6 (six) hours as needed for wheezing or shortness of breath.    Historical Provider, MD  ferrous sulfate 325 (65 FE) MG tablet Take 325 mg by mouth daily with breakfast.    Historical Provider, MD  predniSONE (DELTASONE) 10 MG tablet Take 4 tablets (40 mg total) by mouth daily. 12/20/15   Vanetta Mulders, MD   BP 133/78 mmHg  Pulse 107  Resp 26  Ht  (1.651 m)  Wt 198 lb  (89.812 kg)  BMI 32.95 kg/m2  SpO2 99% Physical Exam  Constitutional: She is oriented to person, place, and time. She appears well-developed and well-nourished.  HENT:  Head: Normocephalic and atraumatic.  Cardiovascular: Regular rhythm.   No murmur heard. Tachycardic  Pulmonary/Chest: Effort normal. No respiratory distress.  Diffuse inspiratory and expiratory wheezes and occasional rhonchi bilaterally  Abdominal: Soft. There is no tenderness. There is no rebound and no guarding.  Musculoskeletal: She exhibits no edema or tenderness.  Neurological: She is alert and oriented to person, place, and time.  Skin: Skin is warm and dry.  Psychiatric: She has a normal mood and affect. Her behavior is normal.  Nursing note and vitals reviewed.   ED Course  Procedures (including critical care time) Labs Review Labs Reviewed - No data to display  Imaging Review No results found. I have personally reviewed and evaluated these images and lab results as part of my medical decision-making.   EKG Interpretation None      MDM   Final diagnoses:  None    Patient with history of asthma here with increased shortness of breath. She is feeling improved following albuterol treatment but has persistent wheezes and rhonchi on examination. Will provide hour-long treatment, solumedrol and reassess.  1450 On repeat evaluation patient is feeling improved  on her hour-long treatment. She does have improved air movement bilaterally with occasional end expiratory wheezes. Plan to continue treatment and then monitor for recurrent symptoms. Patient care transferred pending repeat evaluation.  Tilden FossaElizabeth Omari Mcmanaway, MD 02/14/16 [phone]

## 2016-02-13 NOTE — ED Notes (Signed)
Respiratory at bedside.

## 2016-02-13 NOTE — ED Notes (Signed)
Pt reports has had more coughing with season change.  Last night ran out of inhalers.  Today with sob and wheezing.

## 2016-04-05 ENCOUNTER — Inpatient Hospital Stay (HOSPITAL_BASED_OUTPATIENT_CLINIC_OR_DEPARTMENT_OTHER)
Admission: EM | Admit: 2016-04-05 | Discharge: 2016-04-07 | DRG: 872 | Disposition: A | Discharge status: Home / Self Care | Payer: 59 | Attending: Internal Medicine | Admitting: Internal Medicine

## 2016-04-05 ENCOUNTER — Encounter (HOSPITAL_BASED_OUTPATIENT_CLINIC_OR_DEPARTMENT_OTHER): Payer: Self-pay | Admitting: *Deleted

## 2016-04-05 ENCOUNTER — Emergency Department (HOSPITAL_BASED_OUTPATIENT_CLINIC_OR_DEPARTMENT_OTHER): Payer: 59

## 2016-04-05 DIAGNOSIS — J45901 Unspecified asthma with (acute) exacerbation: Secondary | ICD-10-CM | POA: Diagnosis not present

## 2016-04-05 DIAGNOSIS — E876 Hypokalemia: Secondary | ICD-10-CM | POA: Diagnosis not present

## 2016-04-05 DIAGNOSIS — Z825 Family history of asthma and other chronic lower respiratory diseases: Secondary | ICD-10-CM | POA: Diagnosis not present

## 2016-04-05 DIAGNOSIS — D509 Iron deficiency anemia, unspecified: Secondary | ICD-10-CM | POA: Diagnosis not present

## 2016-04-05 DIAGNOSIS — A419 Sepsis, unspecified organism: Principal | ICD-10-CM | POA: Diagnosis present

## 2016-04-05 DIAGNOSIS — J45902 Unspecified asthma with status asthmaticus: Secondary | ICD-10-CM | POA: Diagnosis present

## 2016-04-05 DIAGNOSIS — Z87891 Personal history of nicotine dependence: Secondary | ICD-10-CM

## 2016-04-05 DIAGNOSIS — J189 Pneumonia, unspecified organism: Secondary | ICD-10-CM

## 2016-04-05 DIAGNOSIS — Z79899 Other long term (current) drug therapy: Secondary | ICD-10-CM

## 2016-04-05 DIAGNOSIS — N920 Excessive and frequent menstruation with regular cycle: Secondary | ICD-10-CM | POA: Diagnosis present

## 2016-04-05 DIAGNOSIS — R0602 Shortness of breath: Secondary | ICD-10-CM | POA: Diagnosis present

## 2016-04-05 LAB — CBC WITH DIFFERENTIAL/PLATELET
Basophils Absolute: 0 10*3/uL (ref 0.0–0.1)
Basophils Relative: 0 %
Eosinophils Absolute: 0.2 10*3/uL (ref 0.0–0.7)
Eosinophils Relative: 1 %
HCT: 31.7 % — ABNORMAL LOW (ref 36.0–46.0)
Hemoglobin: 10.2 g/dL — ABNORMAL LOW (ref 12.0–15.0)
Lymphocytes Relative: 8 %
Lymphs Abs: 1.3 10*3/uL (ref 0.7–4.0)
MCH: 22.2 pg — ABNORMAL LOW (ref 26.0–34.0)
MCHC: 32.2 g/dL (ref 30.0–36.0)
MCV: 69.1 fL — ABNORMAL LOW (ref 78.0–100.0)
Monocytes Absolute: 0.8 10*3/uL (ref 0.1–1.0)
Monocytes Relative: 5 %
Neutro Abs: 14.3 10*3/uL — ABNORMAL HIGH (ref 1.7–7.7)
Neutrophils Relative %: 86 %
Platelets: 460 10*3/uL — ABNORMAL HIGH (ref 150–400)
RBC: 4.59 MIL/uL (ref 3.87–5.11)
RDW: 18.8 % — ABNORMAL HIGH (ref 11.5–15.5)
WBC: 16.6 10*3/uL — ABNORMAL HIGH (ref 4.0–10.5)

## 2016-04-05 LAB — BASIC METABOLIC PANEL
Anion gap: 12 (ref 5–15)
BUN: 9 mg/dL (ref 6–20)
CO2: 21 mmol/L — ABNORMAL LOW (ref 22–32)
Calcium: 9.1 mg/dL (ref 8.9–10.3)
Chloride: 102 mmol/L (ref 101–111)
Creatinine, Ser: 0.88 mg/dL (ref 0.44–1.00)
GFR calc Af Amer: >60 mL/min (ref >60)
GFR calc non Af Amer: >60 mL/min (ref >60)
Glucose, Bld: 127 mg/dL — ABNORMAL HIGH (ref 65–99)
Potassium: 2.7 mmol/L — CL (ref 3.5–5.1)
Sodium: 135 mmol/L (ref 135–145)

## 2016-04-05 LAB — D-DIMER, QUANTITATIVE: D-Dimer, Quant: 0.32 ug/mL-FEU (ref 0.00–0.50)

## 2016-04-05 MED ORDER — ALBUTEROL SULFATE (2.5 MG/3ML) 0.083% IN NEBU
5.0000 mg | INHALATION_SOLUTION | Freq: Once | RESPIRATORY_TRACT | Status: AC
  Administered 2016-04-05: 5 mg via RESPIRATORY_TRACT
  Filled 2016-04-05: qty 6

## 2016-04-05 MED ORDER — DEXTROSE 5 % IV SOLN: 500.0000 mg | Freq: Once | INTRAVENOUS | Status: DC

## 2016-04-05 MED ORDER — DEXTROSE 5 % IV SOLN
1.0000 g | Freq: Once | INTRAVENOUS | Status: AC
  Administered 2016-04-05: 1 g via INTRAVENOUS
  Filled 2016-04-05: qty 10

## 2016-04-05 MED ORDER — ONDANSETRON HCL 4 MG/2ML IJ SOLN
INTRAMUSCULAR | Status: AC
  Administered 2016-04-05: 22:00:00
  Filled 2016-04-05: qty 2

## 2016-04-05 MED ORDER — SODIUM CHLORIDE 0.9 % IV SOLN
INTRAVENOUS | Status: DC
  Administered 2016-04-05: via INTRAVENOUS

## 2016-04-05 MED ORDER — DIAZEPAM 5 MG/ML IJ SOLN
5.0000 mg | Freq: Once | INTRAMUSCULAR | Status: AC
  Administered 2016-04-05: 5 mg via INTRAVENOUS
  Filled 2016-04-05: qty 2

## 2016-04-05 MED ORDER — AZITHROMYCIN 500 MG IV SOLR
INTRAVENOUS | Status: AC
  Filled 2016-04-05: qty 500

## 2016-04-05 MED ORDER — ONDANSETRON HCL 4 MG/2ML IJ SOLN: 4.0000 mg | Freq: Three times a day (TID) | INTRAMUSCULAR | Status: DC | PRN

## 2016-04-05 MED ORDER — POTASSIUM CHLORIDE CRYS ER 20 MEQ PO TBCR
60.0000 meq | EXTENDED_RELEASE_TABLET | Freq: Once | ORAL | Status: AC
  Administered 2016-04-05: 60 meq via ORAL
  Filled 2016-04-05: qty 3

## 2016-04-05 MED ORDER — MAGNESIUM SULFATE 50 % IJ SOLN
1.0000 g | Freq: Once | INTRAMUSCULAR | Status: AC
  Administered 2016-04-05: 1 g via INTRAVENOUS
  Filled 2016-04-05: qty 2

## 2016-04-05 MED ORDER — ALBUTEROL SULFATE (2.5 MG/3ML) 0.083% IN NEBU
INHALATION_SOLUTION | RESPIRATORY_TRACT | Status: AC
  Administered 2016-04-05: 2.5 mg
  Filled 2016-04-05: qty 3

## 2016-04-05 MED ORDER — IPRATROPIUM-ALBUTEROL 0.5-2.5 (3) MG/3ML IN SOLN
RESPIRATORY_TRACT | Status: AC
  Administered 2016-04-05: 3 mL
  Filled 2016-04-05: qty 3

## 2016-04-05 MED ORDER — ONDANSETRON HCL 4 MG/2ML IJ SOLN
4.0000 mg | Freq: Once | INTRAMUSCULAR | Status: AC
  Administered 2016-04-05: 4 mg via INTRAVENOUS

## 2016-04-05 MED ORDER — PREDNISONE 10 MG PO TABS
60.0000 mg | ORAL_TABLET | Freq: Once | ORAL | Status: AC
  Administered 2016-04-05: 60 mg via ORAL
  Filled 2016-04-05: qty 1

## 2016-04-05 MED ORDER — ALBUTEROL (5 MG/ML) CONTINUOUS INHALATION SOLN
15.0000 mg/h | INHALATION_SOLUTION | RESPIRATORY_TRACT | Status: DC
  Administered 2016-04-05: 15 mg/h via RESPIRATORY_TRACT
  Filled 2016-04-05: qty 20

## 2016-04-05 MED ORDER — ALBUTEROL SULFATE (2.5 MG/3ML) 0.083% IN NEBU
2.5000 mg | INHALATION_SOLUTION | RESPIRATORY_TRACT | Status: DC | PRN
Start: 2016-04-05 — End: 2016-04-06
  Administered 2016-04-06: 2.5 mg via RESPIRATORY_TRACT
  Filled 2016-04-05: qty 3

## 2016-04-05 NOTE — Progress Notes (Addendum)
Patient arrived to unit and taken off BIPAP by [facility]. Patient was placed on 3 LPM and tolerating well SPO2 96%. Pt able to speak complete sentences, no accessory muscle use, RR between 16-22. Expiratory Wheezing throughout. Patient states she feels better at this time. BIPAP in room. RN at bedside. RT will continue to assess patient and monitor.

## 2016-04-05 NOTE — ED Notes (Signed)
Dr Emelda BrothersKahut in room with patient now.

## 2016-04-05 NOTE — ED Notes (Signed)
Lab called and states K is 2.7 Primary nurse updated.

## 2016-04-05 NOTE — ED Notes (Signed)
Pt has asthma-reports difficultly breathing x 1 day.  Used inhaler 1 hour PTA.

## 2016-04-05 NOTE — ED Notes (Signed)
Pt was put on O2@3L  per nasal cannula.

## 2016-04-05 NOTE — ED Provider Notes (Signed)
CSN: [id number]     Arrival date & time 04/05/16  1718 History  By signing my name below, I, Levon HedgerElizabeth Hall, attest that this documentation has been prepared under the direction and in the presence of Raeford RazorStephen Nakeisha Greenhouse, MD . Electronically Signed: Levon HedgerElizabeth Hall, Scribe. 04/05/2016. 6:20 PM.   Chief Complaint  Patient presents with  . Asthma    The history is provided by the patient. No language interpreter was used.    HPI Comments:  Harold Hedgelma Byland is a 35 y.o. femalewith PMHx of asthma who presents to the Emergency Department complaining of constant, moderate shortness of breath onset yesterday. She has used her inhaler with no relief. She reports associated chills, sore throat, wheezing, cough, and chest pain due to coughing. Pt states she has been hospitalized for asthma in past, but has never had to have breathing tube. She denies any fever. No other complaints at this time.   Past Medical History  Diagnosis Date  . Asthma   . Anemia    Past Surgical History  Procedure Laterality Date  . Tubal ligation    . Tonsillectomy     History reviewed. No pertinent family history. Social History  Substance Use Topics  . Smoking status: Former Games developermoker  . Smokeless tobacco: Never Used  . Alcohol Use: No     Comment: occasional   OB History    No data available     Review of Systems  Constitutional: Positive for chills. Negative for fever.  HENT: Positive for sore throat.   Respiratory: Positive for cough, shortness of breath and wheezing.   Cardiovascular: Positive for chest pain (cough related).  All other systems reviewed and are negative.  Allergies  Review of patient's allergies indicates no known allergies.  Home Medications   Prior to Admission medications   Medication Sig Start Date End Date Taking? Authorizing Provider  albuterol (PROVENTIL HFA;VENTOLIN HFA) 108 (90 BASE) MCG/ACT inhaler Inhale into the lungs every 6 (six) hours as needed for wheezing or shortness of breath.     Historical Provider, MD  albuterol (PROVENTIL HFA;VENTOLIN HFA) 108 (90 Base) MCG/ACT inhaler Inhale 1-2 puffs into the lungs every 6 (six) hours as needed for wheezing or shortness of breath. 02/13/16   Vanetta MuldersScott Zackowski, MD  ferrous sulfate 325 (65 FE) MG tablet Take 325 mg by mouth daily with breakfast.    Historical Provider, MD  vitamin B-12 (CYANOCOBALAMIN) 100 MCG tablet Take 100 mcg by mouth daily.    Historical Provider, MD   BP 128/92 mmHg  Pulse 130  Temp(Src) 98.8 F (37.1 C) (Oral)  Resp 30  Ht 5\' 5"  (1.651 m)  Wt 209 lb (94.802 kg)  BMI 34.78 kg/m2  SpO2 95%  LMP 03/15/2016 Physical Exam  Constitutional: She is oriented to person, place, and time. She appears well-developed and well-nourished. No distress.  HENT:  Head: Normocephalic and atraumatic.  Eyes: Conjunctivae are normal.  Cardiovascular: Tachycardia present.   Pulmonary/Chest: Effort normal. She has wheezes.  Bilateral expiratory wheezing tachypnea around 30  Abdominal: She exhibits no distension.  Neurological: She is alert and oriented to person, place, and time.  Skin: Skin is warm and dry.  Psychiatric: She has a normal mood and affect.  Nursing note and vitals reviewed.   ED Course  Procedures   CRITICAL CARE Performed by: Raeford RazorKOHUT, Gracemarie Skeet Total critical care time: 35 minutes Critical care time was exclusive of separately billable procedures and treating other patients. Critical care was necessary to treat or prevent imminent  or life-threatening deterioration. Critical care was time spent personally by me on the following activities: development of treatment plan with patient and/or surrogate as well as nursing, discussions with consultants, evaluation of patient's response to treatment, examination of patient, obtaining history from patient or surrogate, ordering and performing treatments and interventions, ordering and review of laboratory studies, ordering and review of radiographic studies, pulse  oximetry and re-evaluation of patient's condition.  DIAGNOSTIC STUDIES:  Oxygen Saturation is 95% on RA, low by my interpretation.    COORDINATION OF CARE:  6:15 PM Discussed treatment plan which includes albuterol solution and prednisone with pt at bedside and pt agreed to plan.  Labs Review Labs Reviewed  CBC WITH DIFFERENTIAL/PLATELET - Abnormal; Notable for the following:    WBC 16.6 (*)    Hemoglobin 10.2 (*)    HCT 31.7 (*)    MCV 69.1 (*)    MCH 22.2 (*)    RDW 18.8 (*)    Platelets 460 (*)    Neutro Abs 14.3 (*)    All other components within normal limits  BASIC METABOLIC PANEL - Abnormal; Notable for the following:    Potassium 2.7 (*)    CO2 21 (*)    Glucose, Bld 127 (*)    All other components within normal limits    Imaging Review No results found. I have personally reviewed and evaluated these images and lab results as part of my medical decision-making.   EKG Interpretation   Date/Time:  Sunday April 05 2016 19:41:11 EDT Ventricular Rate:  141 PR Interval:    QRS Duration: 83 QT Interval:  335 QTC Calculation: 514 R Axis:   73 Text Interpretation:  Sinus tachycardia RSR' in V1 or V2, probably normal  variant Borderline repolarization abnormality Prolonged QT interval  Confirmed by Juleen China  MD, Carime Dinkel ([id number]) on 04/05/2016 8:47:55 PM      MDM   Final diagnoses:  Status asthmaticus, unspecified asthma severity  CAP (community acquired pneumonia)    I personally preformed the services scribed in my presence. The recorded information has been reviewed is accurate. Raeford Razor, MD.   Raeford Razor, MD 04/23/16 2109

## 2016-04-05 NOTE — ED Notes (Signed)
Pt back on BIPAP at previous settings

## 2016-04-05 NOTE — ED Notes (Signed)
[facility]

## 2016-04-05 NOTE — Progress Notes (Signed)
Pt is 35 yo female came with cc of dyspnea.  Labs shows leukocytosis, CXR read by me suggest pneumonia and by ER physician, awaiting radiologist report  She is on Ceft/aztirho  Being tx for CAP and asthma exacerbation  Currently on Bipap there so will admit to step down but if we can get her off BiPAP, will downgrade to Med/surg after evaluation here  I have low suspicion for PE. Asked Er to check DD. If DD is high, will check CTPE. Will check LE US too here. Will also check CTPE if suggested by radiologist.

## 2016-04-05 NOTE — ED Notes (Signed)
Dr Kohut in room with patient now. 

## 2016-04-05 NOTE — ED Notes (Signed)
Pt continues to receive breathing treatment at present.  Converses with no difficulty.  Resting in bed.

## 2016-04-06 ENCOUNTER — Encounter (HOSPITAL_COMMUNITY): Payer: Self-pay | Admitting: *Deleted

## 2016-04-06 DIAGNOSIS — E876 Hypokalemia: Secondary | ICD-10-CM | POA: Diagnosis present

## 2016-04-06 DIAGNOSIS — J45902 Unspecified asthma with status asthmaticus: Secondary | ICD-10-CM

## 2016-04-06 DIAGNOSIS — A419 Sepsis, unspecified organism: Principal | ICD-10-CM

## 2016-04-06 DIAGNOSIS — J45901 Unspecified asthma with (acute) exacerbation: Secondary | ICD-10-CM | POA: Diagnosis present

## 2016-04-06 DIAGNOSIS — D509 Iron deficiency anemia, unspecified: Secondary | ICD-10-CM | POA: Diagnosis present

## 2016-04-06 LAB — RESPIRATORY PANEL BY PCR
ADENOVIRUS-RVPPCR: NOT DETECTED
Bordetella pertussis: NOT DETECTED
CHLAMYDOPHILA PNEUMONIAE-RVPPCR: NOT DETECTED
CORONAVIRUS 229E-RVPPCR: NOT DETECTED
Coronavirus HKU1: NOT DETECTED
Coronavirus NL63: NOT DETECTED
Coronavirus OC43: NOT DETECTED
INFLUENZA A H1 2009-RVPPR: NOT DETECTED
INFLUENZA A H1-RVPPCR: NOT DETECTED
INFLUENZA A-RVPPCR: NOT DETECTED
Influenza A H3: NOT DETECTED
Influenza B: NOT DETECTED
MYCOPLASMA PNEUMONIAE-RVPPCR: NOT DETECTED
Metapneumovirus: NOT DETECTED
PARAINFLUENZA VIRUS 4-RVPPCR: NOT DETECTED
Parainfluenza Virus 1: NOT DETECTED
Parainfluenza Virus 2: NOT DETECTED
Parainfluenza Virus 3: NOT DETECTED
RESPIRATORY SYNCYTIAL VIRUS-RVPPCR: NOT DETECTED
Rhinovirus / Enterovirus: NOT DETECTED

## 2016-04-06 LAB — GLUCOSE, CAPILLARY: Glucose-Capillary: 115 mg/dL — ABNORMAL HIGH (ref 65–99)

## 2016-04-06 LAB — BASIC METABOLIC PANEL
Anion gap: 9 (ref 5–15)
BUN: 7 mg/dL (ref 6–20)
CO2: 22 mmol/L (ref 22–32)
CREATININE: 0.98 mg/dL (ref 0.44–1.00)
Calcium: 9.5 mg/dL (ref 8.9–10.3)
Chloride: 104 mmol/L (ref 101–111)
Glucose, Bld: 142 mg/dL — ABNORMAL HIGH (ref 65–99)
POTASSIUM: 3.9 mmol/L (ref 3.5–5.1)
SODIUM: 135 mmol/L (ref 135–145)

## 2016-04-06 LAB — HIV ANTIBODY (ROUTINE TESTING W REFLEX): HIV SCREEN 4TH GENERATION: NONREACTIVE

## 2016-04-06 LAB — HCG, QUANTITATIVE, PREGNANCY

## 2016-04-06 LAB — PROCALCITONIN

## 2016-04-06 LAB — PROTIME-INR
INR: 1.07 (ref 0.00–1.49)
Prothrombin Time: 14.1 seconds (ref 11.6–15.2)

## 2016-04-06 LAB — LACTIC ACID, PLASMA
LACTIC ACID, VENOUS: 2.7 mmol/L — AB (ref 0.5–1.9)
Lactic Acid, Venous: 1.7 mmol/L (ref 0.5–1.9)

## 2016-04-06 LAB — MRSA PCR SCREENING: MRSA BY PCR: NEGATIVE

## 2016-04-06 LAB — APTT: aPTT: 32 seconds (ref 24–37)

## 2016-04-06 LAB — STREP PNEUMONIAE URINARY ANTIGEN: Strep Pneumo Urinary Antigen: NEGATIVE

## 2016-04-06 MED ORDER — ENOXAPARIN SODIUM 40 MG/0.4ML ~~LOC~~ SOLN
40.0000 mg | SUBCUTANEOUS | Status: DC
Start: 2016-04-06 — End: 2016-04-07
  Administered 2016-04-06 – 2016-04-07 (×2): 40 mg via SUBCUTANEOUS
  Filled 2016-04-06 (×2): qty 0.4

## 2016-04-06 MED ORDER — FERROUS SULFATE 325 (65 FE) MG PO TABS
325.0000 mg | ORAL_TABLET | Freq: Every day | ORAL | Status: DC
  Administered 2016-04-06 – 2016-04-07 (×2): 325 mg via ORAL
  Filled 2016-04-06 (×2): qty 1

## 2016-04-06 MED ORDER — ALBUTEROL SULFATE (2.5 MG/3ML) 0.083% IN NEBU
2.5000 mg | INHALATION_SOLUTION | Freq: Four times a day (QID) | RESPIRATORY_TRACT | Status: DC
  Administered 2016-04-06 – 2016-04-07 (×3): 2.5 mg via RESPIRATORY_TRACT
  Filled 2016-04-06 (×3): qty 3

## 2016-04-06 MED ORDER — METHYLPREDNISOLONE SODIUM SUCC 125 MG IJ SOLR
60.0000 mg | Freq: Three times a day (TID) | INTRAMUSCULAR | Status: DC
  Administered 2016-04-06 – 2016-04-07 (×4): 60 mg via INTRAVENOUS
  Filled 2016-04-06 (×4): qty 2

## 2016-04-06 MED ORDER — CETYLPYRIDINIUM CHLORIDE 0.05 % MT LIQD
7.0000 mL | Freq: Two times a day (BID) | OROMUCOSAL | Status: DC
  Administered 2016-04-06 – 2016-04-07 (×2): 7 mL via OROMUCOSAL

## 2016-04-06 MED ORDER — FAMOTIDINE 20 MG PO TABS
20.0000 mg | ORAL_TABLET | Freq: Two times a day (BID) | ORAL | Status: DC
  Administered 2016-04-06 – 2016-04-07 (×3): 20 mg via ORAL
  Filled 2016-04-06 (×3): qty 1

## 2016-04-06 MED ORDER — FLUTICASONE PROPIONATE 50 MCG/ACT NA SUSP
2.0000 | Freq: Every day | NASAL | Status: DC
  Administered 2016-04-06 – 2016-04-07 (×2): 2 via NASAL
  Filled 2016-04-06: qty 16

## 2016-04-06 MED ORDER — DIPHENHYDRAMINE HCL 25 MG PO CAPS
25.0000 mg | ORAL_CAPSULE | Freq: Every evening | ORAL | Status: DC | PRN
  Administered 2016-04-06: 25 mg via ORAL
  Filled 2016-04-06: qty 1

## 2016-04-06 MED ORDER — IPRATROPIUM BROMIDE 0.02 % IN SOLN
0.5000 mg | RESPIRATORY_TRACT | Status: DC
  Administered 2016-04-06 (×3): 0.5 mg via RESPIRATORY_TRACT
  Filled 2016-04-06 (×2): qty 2.5

## 2016-04-06 MED ORDER — LEVALBUTEROL HCL 1.25 MG/0.5ML IN NEBU
1.2500 mg | INHALATION_SOLUTION | Freq: Four times a day (QID) | RESPIRATORY_TRACT | Status: DC
  Administered 2016-04-06 (×3): 1.25 mg via RESPIRATORY_TRACT
  Filled 2016-04-06 (×2): qty 0.5

## 2016-04-06 MED ORDER — AZITHROMYCIN 500 MG PO TABS
500.0000 mg | ORAL_TABLET | Freq: Every day | ORAL | Status: AC
  Administered 2016-04-06: 500 mg via ORAL
  Filled 2016-04-06: qty 1

## 2016-04-06 MED ORDER — VITAMIN B-12 100 MCG PO TABS
100.0000 ug | ORAL_TABLET | Freq: Every day | ORAL | Status: DC
  Administered 2016-04-06 – 2016-04-07 (×2): 100 ug via ORAL
  Filled 2016-04-06 (×2): qty 1

## 2016-04-06 MED ORDER — AZITHROMYCIN 250 MG PO TABS
250.0000 mg | ORAL_TABLET | Freq: Every day | ORAL | Status: DC
  Administered 2016-04-07: 250 mg via ORAL
  Filled 2016-04-06: qty 1

## 2016-04-06 MED ORDER — DM-GUAIFENESIN ER 30-600 MG PO TB12
1.0000 | ORAL_TABLET | Freq: Two times a day (BID) | ORAL | Status: DC
  Administered 2016-04-06 – 2016-04-07 (×3): 1 via ORAL
  Filled 2016-04-06 (×3): qty 1

## 2016-04-06 MED ORDER — SODIUM CHLORIDE 0.9 % IV BOLUS (SEPSIS)
2500.0000 mL | Freq: Once | INTRAVENOUS | Status: AC
  Administered 2016-04-06: 2500 mL via INTRAVENOUS

## 2016-04-06 MED ORDER — SODIUM CHLORIDE 0.9 % IV SOLN
INTRAVENOUS | Status: DC
  Administered 2016-04-06 (×2): via INTRAVENOUS

## 2016-04-06 MED ORDER — DIPHENHYDRAMINE HCL 25 MG PO CAPS
25.0000 mg | ORAL_CAPSULE | Freq: Once | ORAL | Status: AC
  Administered 2016-04-06: 25 mg via ORAL
  Filled 2016-04-06: qty 1

## 2016-04-06 MED ORDER — HYDROXYZINE HCL 50 MG/ML IM SOLN
25.0000 mg | Freq: Four times a day (QID) | INTRAMUSCULAR | Status: DC | PRN
  Filled 2016-04-06: qty 0.5

## 2016-04-06 NOTE — H&P (Signed)
History and Physical    Carrie Nguyen ZDG:[id number] DOB: 10/26/1980 DOA: 04/05/2016  Referring MD/NP/PA:   PCP: No PCP Per Patient   Patient coming from:  The patient is coming from home.  At baseline, pt is independent for most of ADL.       Chief Complaint: Cough and shortness of breath  HPI: Carrie Nguyen is a [age] y.o. female with medical history significant of asthma, anemia, who presents with cough and shortness of breath.  Patient reports that she has been having shortness of breath and cough for 2 days, which has been progressively getting worse. She does not have sputum production. He has runny nose and mild sore throat. He has mild chest pain, which is induced by coughing. No fever or chills. Has nausea, and vomited twice because of severe coughing yesterday. No abdominal pain, diarrhea. Patient does not have symptoms of UTI. She has heavy menstrual period and iron deficiency anemia. She is taking iron supplements.  ED Course: pt was found to have WBC 16.6, negative d-dimer, temperature normal, tachycardia, tachypnea, oxygen saturation 93% on room air, potassium 2.7, renal function normal, negative chest x-ray for infiltration. Pt was put on BiPAP in ED with some improvement. Patient is admitted to stepdown bed as inpatient.  Review of Systems:   General: no fevers, chills, no changes in body weight, has poor appetite, has fatigue HEENT: no blurry vision, hearing changes or sore throat Pulm: has dyspnea, coughing, wheezing CV: has chest pain, no palpitations Abd: has nausea, vomiting, no abdominal pain, diarrhea, constipation GU: no dysuria, burning on urination, increased urinary frequency, hematuria  Ext: no leg edema Neuro: no unilateral weakness, numbness, or tingling, no vision change or hearing loss Skin: no rash MSK: No muscle spasm, no deformity, no limitation of range of movement in spin Heme: No easy bruising.  Travel history: No recent long distant travel.  Allergy: No  Known Allergies  Past Medical History  Diagnosis Date  . Asthma   . Anemia     Past Surgical History  Procedure Laterality Date  . Tubal ligation    . Tonsillectomy      Social History:  reports that she has quit smoking. She has never used smokeless tobacco. She reports that she does not drink alcohol or use illicit drugs.  Family History:  Family History  Problem Relation Age of Onset  . Lung cancer Mother   . COPD Mother   . Asthma Mother   . Stroke Father   . Hypertension Father   . Diabetes Brother   . Kidney disease Brother      Prior to Admission medications   Medication Sig Start Date End Date Taking? Authorizing Provider  albuterol (PROVENTIL HFA;VENTOLIN HFA) 108 (90 BASE) MCG/ACT inhaler Inhale into the lungs every 6 (six) hours as needed for wheezing or shortness of breath.    Historical Provider, MD  albuterol (PROVENTIL HFA;VENTOLIN HFA) 108 (90 Base) MCG/ACT inhaler Inhale 1-2 puffs into the lungs every 6 (six) hours as needed for wheezing or shortness of breath. 02/13/16   Vanetta MuldersScott Zackowski, MD  ferrous sulfate 325 (65 FE) MG tablet Take 325 mg by mouth daily with breakfast.    Historical Provider, MD  vitamin B-12 (CYANOCOBALAMIN) 100 MCG tablet Take 100 mcg by mouth daily.    Historical Provider, MD    Physical Exam: Filed Vitals:   04/06/16 0007 04/06/16 0015 04/06/16 0030 04/06/16 0353  BP:  102/61 126/75   Pulse:  105 118  Temp:    98.4 F (36.9 C)  TempSrc:    Oral  Resp:  27 26   Height:      Weight:      SpO2: 96% 97% 96%    General: Not in acute distress HEENT:       Eyes: PERRL, EOMI, no scleral icterus.       ENT: No discharge from the ears and nose, no pharynx injection, no tonsillar enlargement.        Neck: No JVD, no bruit, no mass felt. Heme: No neck lymph node enlargement. Cardiac: S1/S2, RRR, No murmurs, No gallops or rubs. Pulm: Diffuse wheezing bilaterally. No rales, wheezing, rhonchi or rubs. Abd: Soft, nondistended,  nontender, no rebound pain, no organomegaly, BS present. GU: No hematuria Ext: No pitting leg edema bilaterally. 2+DP/PT pulse bilaterally. Musculoskeletal: No joint deformities, No joint redness or warmth, no limitation of ROM in spin. Skin: No rashes.  Neuro: Alert, oriented X3, cranial nerves II-XII grossly intact, moves all extremities normally. Psych: Patient is not psychotic, no suicidal or hemocidal ideation.  Labs on Admission: I have personally reviewed following labs and imaging studies  CBC:  Recent Labs Lab 04/05/16 1945  WBC 16.6*  NEUTROABS 14.3*  HGB 10.2*  HCT 31.7*  MCV 69.1*  PLT 460*   Basic Metabolic Panel:  Recent Labs Lab 04/05/16 1945  NA 135  K 2.7*  CL 102  CO2 21*  GLUCOSE 127*  BUN 9  CREATININE 0.88  CALCIUM 9.1   GFR: Estimated Creatinine Clearance: 99.1 mL/min (by C-G formula based on Cr of 0.88). Liver Function Tests: No results for input(s): AST, ALT, ALKPHOS, BILITOT, PROT, ALBUMIN in the last 168 hours. No results for input(s): LIPASE, AMYLASE in the last 168 hours. No results for input(s): AMMONIA in the last 168 hours. Coagulation Profile: No results for input(s): INR, PROTIME in the last 168 hours. Cardiac Enzymes: No results for input(s): CKTOTAL, CKMB, CKMBINDEX, TROPONINI in the last 168 hours. BNP (last 3 results) No results for input(s): PROBNP in the last 8760 hours. HbA1C: No results for input(s): HGBA1C in the last 72 hours. CBG: No results for input(s): GLUCAP in the last 168 hours. Lipid Profile: No results for input(s): CHOL, HDL, LDLCALC, TRIG, CHOLHDL, LDLDIRECT in the last 72 hours. Thyroid Function Tests: No results for input(s): TSH, T4TOTAL, FREET4, T3FREE, THYROIDAB in the last 72 hours. Anemia Panel: No results for input(s): VITAMINB12, FOLATE, FERRITIN, TIBC, IRON, RETICCTPCT in the last 72 hours. Urine analysis:    Component Value Date/Time   COLORURINE YELLOW 02/16/2015 1342   APPEARANCEUR CLOUDY*  02/16/2015 1342   LABSPEC 1.025 02/16/2015 1342   PHURINE 6.0 02/16/2015 1342   GLUCOSEU NEGATIVE 02/16/2015 1342   HGBUR SMALL* 02/16/2015 1342   BILIRUBINUR NEGATIVE 02/16/2015 1342   KETONESUR NEGATIVE 02/16/2015 1342   PROTEINUR NEGATIVE 02/16/2015 1342   UROBILINOGEN 0.2 02/16/2015 1342   NITRITE POSITIVE* 02/16/2015 1342   LEUKOCYTESUR LARGE* 02/16/2015 1342   Sepsis Labs: (procalcitonin:4,lacticidven:4) ) Recent Results (from the past 240 hour(s))  MRSA PCR Screening     Status: None   Collection Time: 04/05/16 11:15 PM  Result Value Ref Range Status   MRSA by PCR NEGATIVE NEGATIVE Final    Comment:        The GeneXpert MRSA Assay (FDA approved for NASAL specimens only), is one component of a comprehensive MRSA colonization surveillance program. It is not intended to diagnose MRSA infection nor to guide or monitor treatment for MRSA  infections.      Radiological Exams on Admission: Dg Chest Portable 1 View  04/05/2016  CLINICAL DATA:  Shortness of breath EXAM: PORTABLE CHEST 1 VIEW COMPARISON:  June 17, 2015 FINDINGS: The heart size and mediastinal contours are within normal limits. Both lungs are clear. The visualized skeletal structures are unremarkable. IMPRESSION: No active disease. Electronically Signed   By: Gerome Sam III M.D   On: 04/05/2016 21:40     EKG: Independently reviewed. QTC 514, sinus rhythm, tachycardia.  Assessment/Plan Principal Problem:   Asthma with status asthmaticus Active Problems:   Microcytic anemia   Hypokalemia   Sepsis (HCC)   Asthma exacerbation   Asthma with status asthmaticus: Patient's cough, shortness breath, wheezing on auscultation are consistent with asthma exacerbation. No infiltration on chest x-ray.  -will admit to SDU due to BiPAP need -Z-pak x 5 days -Urine pneumococcal antigen -Nebulizers: scheduled Atrovent and prn Xopenex -Solu-Medrol 60 mg IV q8h  -Mucinex for cough  -Blood and  sputum culture, respiratory viral panel  Possible sepsis: Patient may have sepsis given leukocytosis, tachycardia and tachypnea. Lactate is pending. Currently hemodynamically stable. -will get Procalcitonin and trend lactic acid levels per sepsis protocol. -IVF: 2.5L of NS bolus in ED, followed by 100 cc/h   Hypokalemia: K= 2.7 on admission. - Repleted with 60 mEQ of KCl - 1 g of magnesium sulfate was also given  Microcytic anemia: Hemoglobin 10.2. Most likely due to heavy menstrual periods. -continue iron supplement   DVT ppx: Q Lovenox Code Status: Full code Family Communication: None at bed side.  Disposition Plan:  Anticipate discharge back to previous home environment Consults called:  none Admission status:  SDU/inpation       Date of Service 04/06/2016    Lorretta Harp [facility] Pager [phone]  If 7PM-7AM, please contact night-coverage [email or web address] Password [facility] 04/06/2016, 4:35 AM

## 2016-04-06 NOTE — Progress Notes (Signed)
PROGRESS NOTE    Carrie Nguyen  ZOX:[id number] DOB: 03/25/1981 DOA: 04/05/2016 PCP: No PCP Per Patient   Brief Narrative:  HPI on 04/06/2016 by Dr. Lorretta Harp Carrie Nguyen is a 35 y.o. female with medical history significant of asthma, anemia, who presents with cough and shortness of breath.  Patient reports that she has been having shortness of breath and cough for 2 days, which has been progressively getting worse. She does not have sputum production. He has runny nose and mild sore throat. He has mild chest pain, which is induced by coughing. No fever or chills. Has nausea, and vomited twice because of severe coughing yesterday. No abdominal pain, diarrhea. Patient does not have symptoms of UTI. She has heavy menstrual period and iron deficiency anemia. She is taking iron supplements. Assessment & Plan   Asthma with status asthmaticus -Currently has cough, shortness breath, wheezing on auscultation are consistent with asthma exacerbation. -CXR unremarkable for infection -Continue supplemental oxygen and BiPAP if needed  -Continue azithromycin, Solu-Medrol, nebulizer treatments -Sputum and blood cultures pending -Respiratory viral panel negative -Strep pneumonia urine antigen negative  Possible sepsis -Upon admission, patient had leukocytosis, tachycardia, tachypnea with an elevated lactic acid level 2.7 -Latic level trending downward, currently 1.7 -Currently no complaints of urinary symptoms. Chest x-ray unremarkable for infection -Blood cultures and sputum culture pending -Continue to monitor closely  Hypokalemia -Potassium 2.7 on admission, currently 3.9 -Continue to monitor BMP  Microcytic anemia -Hemoglobin 10.2. Most likely due to heavy menstrual periods. -continue iron supplement and monitor CBC  DVT Prophylaxis  lovenox  Code Status: Full  Family Communication: None at bedside  Disposition Plan: Admitted, monitor in stepdown  Consultants None  Procedures   None  Antibiotics   Anti-infectives    Start     Dose/Rate Route Frequency Ordered Stop   04/07/16 1000  azithromycin (ZITHROMAX) tablet 250 mg     250 mg Oral Daily 04/06/16 0419 04/11/16 0959   04/06/16 1000  azithromycin (ZITHROMAX) tablet 500 mg     500 mg Oral Daily 04/06/16 0419 04/06/16 0853   04/05/16 2100  cefTRIAXone (ROCEPHIN) 1 g in dextrose 5 % 50 mL IVPB     1 g 100 mL/hr over 30 Minutes Intravenous  Once 04/05/16 2048 04/05/16 2127   04/05/16 2100  azithromycin (ZITHROMAX) 500 mg in dextrose 5 % 250 mL IVPB  Status:  Discontinued     500 mg 250 mL/hr over 60 Minutes Intravenous  Once 04/05/16 2048 04/06/16 0419   04/05/16 2051  azithromycin (ZITHROMAX) 500 MG injection    Comments:  Dallas Schimke   : cabinet override      04/05/16 2051 04/06/16 0859      Subjective:   Carrie Nguyen seen and examined today.  Patient is feeling her breathing is better.  She feels nasal congestion and continues to cough.  Denies chest pain, abdominal pain, Nausea, vomiting, dizziness, headache.    Objective:   Filed Vitals:   04/06/16 0810 04/06/16 0900 04/06/16 0925 04/06/16 1000  BP: 146/87     Pulse: 103 106  105  Temp: 97.9 F (36.6 C)     TempSrc: Oral     Resp: 24 15  20   Height:      Weight:      SpO2: 95% 94% 97% 96%    Intake/Output Summary (Last 24 hours) at 04/06/16 1203 Last data filed at 04/06/16 1000  Gross per 24 hour  Intake 3238.33 ml  Output  825 ml  Net 2413.33 ml   Filed Weights   04/05/16 1722 04/05/16 2320  Weight: 94.802 kg (209 lb) 92.1 kg (203 lb 0.7 oz)    Exam  General: Well developed, well nourished, NAD, appears stated age  HEENT: NCAT, mucous membranes moist.   Cardiovascular: S1 S2 auscultated, no rubs, murmurs or gallops. tachycardic   Respiratory: Diffuse exp wheezing, with equal chest rise  Abdomen: Soft, obese, nontender, nondistended, + bowel sounds  Extremities: warm dry without cyanosis clubbing or edema  Neuro:  AAOx3, nonfocal  Skin: Without rashes exudates or nodules  Psych: Normal affect and demeanor with intact judgement and insight   Data Reviewed: I have personally reviewed following labs and imaging studies  CBC:  Recent Labs Lab 04/05/16 1945  WBC 16.6*  NEUTROABS 14.3*  HGB 10.2*  HCT 31.7*  MCV 69.1*  PLT 460*   Basic Metabolic Panel:  Recent Labs Lab 04/05/16 1945 04/06/16 0349  NA 135 135  K 2.7* 3.9  CL 102 104  CO2 21* 22  GLUCOSE 127* 142*  BUN 9 7  CREATININE 0.88 0.98  CALCIUM 9.1 9.5   GFR: Estimated Creatinine Clearance: 89 mL/min (by C-G formula based on Cr of 0.98). Liver Function Tests: No results for input(s): AST, ALT, ALKPHOS, BILITOT, PROT, ALBUMIN in the last 168 hours. No results for input(s): LIPASE, AMYLASE in the last 168 hours. No results for input(s): AMMONIA in the last 168 hours. Coagulation Profile:  Recent Labs Lab 04/06/16 0543  INR 1.07   Cardiac Enzymes: No results for input(s): CKTOTAL, CKMB, CKMBINDEX, TROPONINI in the last 168 hours. BNP (last 3 results) No results for input(s): PROBNP in the last 8760 hours. HbA1C: No results for input(s): HGBA1C in the last 72 hours. CBG:  Recent Labs Lab 04/06/16 0520  GLUCAP 115*   Lipid Profile: No results for input(s): CHOL, HDL, LDLCALC, TRIG, CHOLHDL, LDLDIRECT in the last 72 hours. Thyroid Function Tests: No results for input(s): TSH, T4TOTAL, FREET4, T3FREE, THYROIDAB in the last 72 hours. Anemia Panel: No results for input(s): VITAMINB12, FOLATE, FERRITIN, TIBC, IRON, RETICCTPCT in the last 72 hours. Urine analysis:    Component Value Date/Time   COLORURINE YELLOW 02/16/2015 1342   APPEARANCEUR CLOUDY* 02/16/2015 1342   LABSPEC 1.025 02/16/2015 1342   PHURINE 6.0 02/16/2015 1342   GLUCOSEU NEGATIVE 02/16/2015 1342   HGBUR SMALL* 02/16/2015 1342   BILIRUBINUR NEGATIVE 02/16/2015 1342   KETONESUR NEGATIVE 02/16/2015 1342   PROTEINUR NEGATIVE 02/16/2015 1342    UROBILINOGEN 0.2 02/16/2015 1342   NITRITE POSITIVE* 02/16/2015 1342   LEUKOCYTESUR LARGE* 02/16/2015 1342   Sepsis Labs: @LABRCNTIP (procalcitonin:4,lacticidven:4)  ) Recent Results (from the past 240 hour(s))  MRSA PCR Screening     Status: None   Collection Time: 04/05/16 11:15 PM  Result Value Ref Range Status   MRSA by PCR NEGATIVE NEGATIVE Final    Comment:        The GeneXpert MRSA Assay (FDA approved for NASAL specimens only), is one component of a comprehensive MRSA colonization surveillance program. It is not intended to diagnose MRSA infection nor to guide or monitor treatment for MRSA infections.   Respiratory Panel by PCR     Status: None   Collection Time: 04/06/16  5:23 AM  Result Value Ref Range Status   Adenovirus NOT DETECTED NOT DETECTED Final   Coronavirus 229E NOT DETECTED NOT DETECTED Final   Coronavirus HKU1 NOT DETECTED NOT DETECTED Final   Coronavirus NL63 NOT DETECTED NOT DETECTED  Final   Coronavirus OC43 NOT DETECTED NOT DETECTED Final   Metapneumovirus NOT DETECTED NOT DETECTED Final   Rhinovirus / Enterovirus NOT DETECTED NOT DETECTED Final   Influenza A NOT DETECTED NOT DETECTED Final   Influenza A H1 NOT DETECTED NOT DETECTED Final   Influenza A H1 2009 NOT DETECTED NOT DETECTED Final   Influenza A H3 NOT DETECTED NOT DETECTED Final   Influenza B NOT DETECTED NOT DETECTED Final   Parainfluenza Virus 1 NOT DETECTED NOT DETECTED Final   Parainfluenza Virus 2 NOT DETECTED NOT DETECTED Final   Parainfluenza Virus 3 NOT DETECTED NOT DETECTED Final   Parainfluenza Virus 4 NOT DETECTED NOT DETECTED Final   Respiratory Syncytial Virus NOT DETECTED NOT DETECTED Final   Bordetella pertussis NOT DETECTED NOT DETECTED Final   Chlamydophila pneumoniae NOT DETECTED NOT DETECTED Final   Mycoplasma pneumoniae NOT DETECTED NOT DETECTED Final      Radiology Studies: Dg Chest Portable 1 View  04/05/2016  CLINICAL DATA:  Shortness of breath EXAM: PORTABLE  CHEST 1 VIEW COMPARISON:  June 17, 2015 FINDINGS: The heart size and mediastinal contours are within normal limits. Both lungs are clear. The visualized skeletal structures are unremarkable. IMPRESSION: No active disease. Electronically Signed   By: Gerome Sam III M.D   On: 04/05/2016 21:40     Scheduled Meds: . antiseptic oral rinse  7 mL Mouth Rinse BID  . [START ON 04/07/2016] azithromycin  250 mg Oral Daily  . dextromethorphan-guaiFENesin  1 tablet Oral BID  . enoxaparin (LOVENOX) injection  40 mg Subcutaneous Q24H  . famotidine  20 mg Oral BID  . ferrous sulfate  325 mg Oral Q breakfast  . fluticasone  2 spray Each Nare Daily  . ipratropium  0.5 mg Nebulization Q4H  . levalbuterol  1.25 mg Nebulization Q6H  . methylPREDNISolone (SOLU-MEDROL) injection  60 mg Intravenous Q8H  . vitamin B-12  100 mcg Oral Daily   Continuous Infusions: . sodium chloride 100 mL/hr at 04/06/16 0742     LOS: 1 day   Time Spent in minutes   35 minutes  Jamey Harman D.O. on 04/06/2016 at 12:03 PM  Between 7am to 7pm - Pager - [phone]  After 7pm go to [email or web address] - password [email or web address]  And look for the night coverage person covering for me after hours  [facility] Office  [phone]

## 2016-04-06 NOTE — Progress Notes (Signed)
Patient not able to cough up anything at this time. Sputum container left on bedside table by RN.

## 2016-04-07 DIAGNOSIS — R6510 Systemic inflammatory response syndrome (SIRS) of non-infectious origin without acute organ dysfunction: Secondary | ICD-10-CM

## 2016-04-07 DIAGNOSIS — D509 Iron deficiency anemia, unspecified: Secondary | ICD-10-CM

## 2016-04-07 DIAGNOSIS — E876 Hypokalemia: Secondary | ICD-10-CM

## 2016-04-07 DIAGNOSIS — J45901 Unspecified asthma with (acute) exacerbation: Secondary | ICD-10-CM

## 2016-04-07 LAB — BASIC METABOLIC PANEL
ANION GAP: 9 (ref 5–15)
BUN: 10 mg/dL (ref 6–20)
CHLORIDE: 107 mmol/L (ref 101–111)
CO2: 22 mmol/L (ref 22–32)
Calcium: 9.7 mg/dL (ref 8.9–10.3)
Creatinine, Ser: 0.86 mg/dL (ref 0.44–1.00)
GFR calc Af Amer: >60 mL/min (ref >60)
GFR calc non Af Amer: >60 mL/min (ref >60)
GLUCOSE: 117 mg/dL — AB (ref 65–99)
POTASSIUM: 4.5 mmol/L (ref 3.5–5.1)
Sodium: 138 mmol/L (ref 135–145)

## 2016-04-07 LAB — CBC
HEMATOCRIT: 31.3 % — AB (ref 36.0–46.0)
Hemoglobin: 9.8 g/dL — ABNORMAL LOW (ref 12.0–15.0)
MCH: 21.9 pg — AB (ref 26.0–34.0)
MCHC: 31.3 g/dL (ref 30.0–36.0)
MCV: 70 fL — AB (ref 78.0–100.0)
PLATELETS: 429 10*3/uL — AB (ref 150–400)
RBC: 4.47 MIL/uL (ref 3.87–5.11)
RDW: 19 % — ABNORMAL HIGH (ref 11.5–15.5)
WBC: 17.7 10*3/uL — AB (ref 4.0–10.5)

## 2016-04-07 MED ORDER — PREDNISONE 10 MG PO TABS: ORAL_TABLET | ORAL | Status: DC

## 2016-04-07 MED ORDER — ALBUTEROL SULFATE (2.5 MG/3ML) 0.083% IN NEBU: 2.5000 mg | INHALATION_SOLUTION | Freq: Four times a day (QID) | RESPIRATORY_TRACT | Status: DC

## 2016-04-07 MED ORDER — FLUTICASONE PROPIONATE 50 MCG/ACT NA SUSP: 2.0000 | Freq: Every day | NASAL | Status: DC

## 2016-04-07 MED ORDER — AZITHROMYCIN 250 MG PO TABS: 250.0000 mg | ORAL_TABLET | Freq: Every day | ORAL | Status: DC

## 2016-04-07 MED ORDER — DM-GUAIFENESIN ER 30-600 MG PO TB12: 1.0000 | ORAL_TABLET | Freq: Two times a day (BID) | ORAL | Status: DC

## 2016-04-07 NOTE — Discharge Summary (Signed)
Physician Discharge Summary  Carrie Nguyen ZOX:[id number] DOB: [birth year] DOA: 04/05/2016  PCP: No PCP Per Patient  Admit date: 04/05/2016 Discharge date: 04/07/2016  Time spent: 45 minutes  Recommendations for Outpatient Follow-up:  Patient will be discharged to home.  Patient will need to follow up with primary care provider within one week of discharge.  Patient should continue medications as prescribed.  Patient should follow a regular diet.   Discharge Diagnoses:  Asthma with status asthmaticus Possible sepsis Hypokalemia Microcytic anemia  Discharge Condition: Stable  Diet recommendation: Regular  Filed Weights   04/05/16 1722 04/05/16 2320  Weight: 94.802 kg (209 lb) 92.1 kg (203 lb 0.7 oz)    History of present illness:  on 04/06/2016 by Dr. Lorretta Harp Carrie Nguyen is a 35 y.o. female with medical history significant of asthma, anemia, who presents with cough and shortness of breath.  Patient reports that she has been having shortness of breath and cough for 2 days, which has been progressively getting worse. She does not have sputum production. He has runny nose and mild sore throat. He has mild chest pain, which is induced by coughing. No fever or chills. Has nausea, and vomited twice because of severe coughing yesterday. No abdominal pain, diarrhea. Patient does not have symptoms of UTI. She has heavy menstrual period and iron deficiency anemia. She is taking iron supplements.  Hospital Course:  Asthma with status asthmaticus -Currently has cough, shortness breath, wheezing on auscultation are consistent with asthma exacerbation. -CXR unremarkable for infection -Continue supplemental oxygen and BiPAP if needed - patient weaned off of oxygen. Did not need BiPAP overnight. -Placed on azithromycin, Solu-Medrol, nebulizer treatments -Sputum and blood cultures pending -Respiratory viral panel negative -Strep pneumonia urine antigen negative -Will discharge patient with  nebulizer and meds, azithromycin and prednisone taper  Possible sepsis -Upon admission, patient had leukocytosis, tachycardia, tachypnea with an elevated lactic acid level 2.7 -Latic level trending downward, currently 1.7 -Currently no complaints of urinary symptoms. Chest x-ray unremarkable for infection -Blood cultures and sputum culture pending -Suspect leukocytosis due to steroids -Repeat CBC in one week  Hypokalemia -Potassium 2.7 on admission, currently 4.5 -Repeat BMP in one week  Microcytic anemia -Hemoglobin 9.8. Most likely due to heavy menstrual periods. -continue iron supplement and monitor CBC  Consultants None  Procedures  None  Discharge Exam: Filed Vitals:   04/07/16 0736 04/07/16 0800  BP: 128/86 113/63  Pulse: 79 81  Temp: 98.4 F (36.9 C)   Resp: 19 23    Exam  General: Well developed, well nourished, NAD  HEENT: NCAT, mucous membranes moist.   Cardiovascular: S1 S2 auscultated, no rubs, murmurs or gallops. RRR  Respiratory: End exp wheezing, much improved. Good air movement.  Abdomen: Soft, obese, nontender, nondistended, + bowel sounds  Extremities: warm dry without cyanosis clubbing or edema  Neuro: AAOx3, nonfocal  Psych: Normal affect and demeanor, pleasant  Discharge Instructions      Discharge Instructions    Discharge instructions    Complete by:  As directed   Patient will be discharged to home.  Patient will need to follow up with primary care provider within one week of discharge.  Patient should continue medications as prescribed.  Patient should follow a regular diet.            Medication List    TAKE these medications        PROAIR HFA 108 (90 Base) MCG/ACT inhaler  Generic drug:  albuterol  Inhale 2 puffs into  the lungs every 6 (six) hours as needed for wheezing or shortness of breath.     albuterol 108 (90 Base) MCG/ACT inhaler  Commonly known as:  PROVENTIL HFA;VENTOLIN HFA  Inhale 1-2 puffs into the  lungs every 6 (six) hours as needed for wheezing or shortness of breath.     albuterol (2.5 MG/3ML) 0.083% nebulizer solution  Commonly known as:  PROVENTIL  Take 3 mLs (2.5 mg total) by nebulization 4 (four) times daily.     azithromycin 250 MG tablet  Commonly known as:  ZITHROMAX  Take 1 tablet (250 mg total) by mouth daily.     dextromethorphan-guaiFENesin 30-600 MG 12hr tablet  Commonly known as:  MUCINEX DM  Take 1 tablet by mouth 2 (two) times daily.     fluticasone 50 MCG/ACT nasal spray  Commonly known as:  FLONASE  Place 2 sprays into both nostrils daily.     predniSONE 10 MG tablet  Commonly known as:  DELTASONE  Take  40mg  (4 tabs) x 3 days, then taper to 30mg  (3 tabs) x 3 days, then 20mg  (2 tabs) x 3days, then 10mg  (1 tab) x 3days, then OFF.     triamcinolone ointment 0.1 %  Commonly known as:  KENALOG  Apply 1 application topically 2 (two) times daily as needed (facial eczema).     vitamin B-12 1000 MCG tablet  Commonly known as:  CYANOCOBALAMIN  Take 1,000 mcg by mouth daily.       No Known Allergies Follow-up Information    Follow up with Primary care physician. Schedule an appointment as soon as possible for a visit in 1 week.   Why:  Hospital follow up, establish care       The results of significant diagnostics from this hospitalization (including imaging, microbiology, ancillary and laboratory) are listed below for reference.    Significant Diagnostic Studies: Dg Chest Portable 1 View  04/05/2016  CLINICAL DATA:  Shortness of breath EXAM: PORTABLE CHEST 1 VIEW COMPARISON:  June 17, 2015 FINDINGS: The heart size and mediastinal contours are within normal limits. Both lungs are clear. The visualized skeletal structures are unremarkable. IMPRESSION: No active disease. Electronically Signed   By: Gerome Sam III M.D   On: 04/05/2016 21:40    Microbiology: Recent Results (from the past 240 hour(s))  MRSA PCR Screening     Status: None    Collection Time: 04/05/16 11:15 PM  Result Value Ref Range Status   MRSA by PCR NEGATIVE NEGATIVE Final    Comment:        The GeneXpert MRSA Assay (FDA approved for NASAL specimens only), is one component of a comprehensive MRSA colonization surveillance program. It is not intended to diagnose MRSA infection nor to guide or monitor treatment for MRSA infections.   Respiratory Panel by PCR     Status: None   Collection Time: 04/06/16  5:23 AM  Result Value Ref Range Status   Adenovirus NOT DETECTED NOT DETECTED Final   Coronavirus 229E NOT DETECTED NOT DETECTED Final   Coronavirus HKU1 NOT DETECTED NOT DETECTED Final   Coronavirus NL63 NOT DETECTED NOT DETECTED Final   Coronavirus OC43 NOT DETECTED NOT DETECTED Final   Metapneumovirus NOT DETECTED NOT DETECTED Final   Rhinovirus / Enterovirus NOT DETECTED NOT DETECTED Final   Influenza A NOT DETECTED NOT DETECTED Final   Influenza A H1 NOT DETECTED NOT DETECTED Final   Influenza A H1 2009 NOT DETECTED NOT DETECTED Final   Influenza A H3  NOT DETECTED NOT DETECTED Final   Influenza B NOT DETECTED NOT DETECTED Final   Parainfluenza Virus 1 NOT DETECTED NOT DETECTED Final   Parainfluenza Virus 2 NOT DETECTED NOT DETECTED Final   Parainfluenza Virus 3 NOT DETECTED NOT DETECTED Final   Parainfluenza Virus 4 NOT DETECTED NOT DETECTED Final   Respiratory Syncytial Virus NOT DETECTED NOT DETECTED Final   Bordetella pertussis NOT DETECTED NOT DETECTED Final   Chlamydophila pneumoniae NOT DETECTED NOT DETECTED Final   Mycoplasma pneumoniae NOT DETECTED NOT DETECTED Final     Labs: Basic Metabolic Panel:  Recent Labs Lab 04/05/16 1945 04/06/16 0349 04/07/16 0322  NA 135 135 138  K 2.7* 3.9 4.5  CL 102 104 107  CO2 21* 22 22  GLUCOSE 127* 142* 117*  BUN 9 7 10   CREATININE 0.88 0.98 0.86  CALCIUM 9.1 9.5 9.7   Liver Function Tests: No results for input(s): AST, ALT, ALKPHOS, BILITOT, PROT, ALBUMIN in the last 168 hours. No  results for input(s): LIPASE, AMYLASE in the last 168 hours. No results for input(s): AMMONIA in the last 168 hours. CBC:  Recent Labs Lab 04/05/16 1945 04/07/16 0322  WBC 16.6* 17.7*  NEUTROABS 14.3*  --   HGB 10.2* 9.8*  HCT 31.7* 31.3*  MCV 69.1* 70.0*  PLT 460* 429*   Cardiac Enzymes: No results for input(s): CKTOTAL, CKMB, CKMBINDEX, TROPONINI in the last 168 hours. BNP: BNP (last 3 results) No results for input(s): BNP in the last 8760 hours.  ProBNP (last 3 results) No results for input(s): PROBNP in the last 8760 hours.  CBG:  Recent Labs Lab 04/06/16 0520  GLUCAP 115*       Signed:  Edsel PetrinMIKHAIL, Slayter Moorhouse  [facility] 04/07/2016, 10:09 AM

## 2016-04-07 NOTE — Progress Notes (Signed)
Spoke w pt reg prim care phy. Pt has bcbs ins and lives in [place]. Enc pt to call her customer service or ck w phy prac near where she lives or works. Have spoken w Vaughan Bastajermaine w [facility] for home neb machine. He will speak w pt and get ordered.

## 2016-04-07 NOTE — Discharge Instructions (Signed)
Asthma, Adult Asthma is a recurring condition in which the airways tighten and narrow. Asthma can make it difficult to breathe. It can cause coughing, wheezing, and shortness of breath. Asthma episodes, also called asthma attacks, range from minor to life-threatening. Asthma cannot be cured, but medicines and lifestyle changes can help control it. CAUSES Asthma is believed to be caused by inherited (genetic) and environmental factors, but its exact cause is unknown. Asthma may be triggered by allergens, lung infections, or irritants in the air. Asthma triggers are different for each person. Common triggers include:   Animal dander.  Dust mites.  Cockroaches.  Pollen from trees or grass.  Mold.  Smoke.  Air pollutants such as dust, household cleaners, hair sprays, aerosol sprays, paint fumes, strong chemicals, or strong odors.  Cold air, weather changes, and winds (which increase molds and pollens in the air).  Strong emotional expressions such as crying or laughing hard.  Stress.  Certain medicines (such as aspirin) or types of drugs (such as beta-blockers).  Sulfites in foods and drinks. Foods and drinks that may contain sulfites include dried fruit, potato chips, and sparkling grape juice.  Infections or inflammatory conditions such as the flu, a cold, or an inflammation of the nasal membranes (rhinitis).  Gastroesophageal reflux disease (GERD).  Exercise or strenuous activity. SYMPTOMS Symptoms may occur immediately after asthma is triggered or many hours later. Symptoms include:  Wheezing.  Excessive nighttime or early morning coughing.  Frequent or severe coughing with a common cold.  Chest tightness.  Shortness of breath. DIAGNOSIS  The diagnosis of asthma is made by a review of your medical history and a physical exam. Tests may also be performed. These may include:  Lung function studies. These tests show how much air you breathe in and out.  Allergy  tests.  Imaging tests such as X-rays. TREATMENT  Asthma cannot be cured, but it can usually be controlled. Treatment involves identifying and avoiding your asthma triggers. It also involves medicines. There are 2 classes of medicine used for asthma treatment:   Controller medicines. These prevent asthma symptoms from occurring. They are usually taken every day.  Reliever or rescue medicines. These quickly relieve asthma symptoms. They are used as needed and provide short-term relief. Your health care provider will help you create an asthma action plan. An asthma action plan is a written plan for managing and treating your asthma attacks. It includes a list of your asthma triggers and how they may be avoided. It also includes information on when medicines should be taken and when their dosage should be changed. An action plan may also involve the use of a device called a peak flow meter. A peak flow meter measures how well the lungs are working. It helps you monitor your condition. HOME CARE INSTRUCTIONS   Take medicines only as directed by your health care provider. Speak with your health care provider if you have questions about how or when to take the medicines.  Use a peak flow meter as directed by your health care provider. Record and keep track of readings.  Understand and use the action plan to help minimize or stop an asthma attack without needing to seek medical care.  Control your home environment in the following ways to help prevent asthma attacks:  Do not smoke. Avoid being exposed to secondhand smoke.  Change your heating and air conditioning filter regularly.  Limit your use of fireplaces and wood stoves.  Get rid of pests (such as roaches   and mice) and their droppings.  Throw away plants if you see mold on them.  Clean your floors and dust regularly. Use unscented cleaning products.  Try to have someone else vacuum for you regularly. Stay out of rooms while they are  being vacuumed and for a short while afterward. If you vacuum, use a dust mask from a hardware store, a double-layered or microfilter vacuum cleaner bag, or a vacuum cleaner with a HEPA filter.  Replace carpet with wood, tile, or vinyl flooring. Carpet can trap dander and dust.  Use allergy-proof pillows, mattress covers, and box spring covers.  Wash bed sheets and blankets every week in hot water and dry them in a dryer.  Use blankets that are made of polyester or cotton.  Clean bathrooms and kitchens with bleach. If possible, have someone repaint the walls in these rooms with mold-resistant paint. Keep out of the rooms that are being cleaned and painted.  Wash hands frequently. SEEK MEDICAL CARE IF:   You have wheezing, shortness of breath, or a cough even if taking medicine to prevent attacks.  The colored mucus you cough up (sputum) is thicker than usual.  Your sputum changes from clear or white to yellow, green, gray, or bloody.  You have any problems that may be related to the medicines you are taking (such as a rash, itching, swelling, or trouble breathing).  You are using a reliever medicine more than 2-3 times per week.  Your peak flow is still at 50-79% of your personal best after following your action plan for 1 hour.  You have a fever. SEEK IMMEDIATE MEDICAL CARE IF:   You seem to be getting worse and are unresponsive to treatment during an asthma attack.  You are short of breath even at rest.  You get short of breath when doing very little physical activity.  You have difficulty eating, drinking, or talking due to asthma symptoms.  You develop chest pain.  You develop a fast heartbeat.  You have a bluish color to your lips or fingernails.  You are light-headed, dizzy, or faint.  Your peak flow is less than 50% of your personal best.   This information is not intended to replace advice given to you by your health care provider. Make sure you discuss any  questions you have with your health care provider.   Document Released: 09/07/2005 Document Revised: 05/29/2015 Document Reviewed: 04/06/2013 Elsevier Interactive Patient Education 2016 [employer] Inc.  

## 2016-04-10 MED FILL — PROAIR HFA 90 MCG INHALER: 108 (90 BAS | 30 days supply | Qty: 9 | Fill #1

## 2016-04-11 LAB — CULTURE, BLOOD (ROUTINE X 2)
CULTURE: NO GROWTH
Culture: NO GROWTH

## 2016-06-03 MED FILL — PROAIR HFA 90 MCG INHALER: 108 (90 BAS | 30 days supply | Qty: 9 | Fill #2

## 2016-09-07 ENCOUNTER — Emergency Department (HOSPITAL_BASED_OUTPATIENT_CLINIC_OR_DEPARTMENT_OTHER)
Admission: EM | Admit: 2016-09-07 | Discharge: 2016-09-07 | Disposition: A | Discharge status: Home / Self Care | Payer: 59 | Attending: Emergency Medicine | Admitting: Emergency Medicine

## 2016-09-07 ENCOUNTER — Encounter (HOSPITAL_BASED_OUTPATIENT_CLINIC_OR_DEPARTMENT_OTHER): Payer: Self-pay | Admitting: *Deleted

## 2016-09-07 DIAGNOSIS — Z79899 Other long term (current) drug therapy: Secondary | ICD-10-CM | POA: Diagnosis not present

## 2016-09-07 DIAGNOSIS — J45901 Unspecified asthma with (acute) exacerbation: Secondary | ICD-10-CM | POA: Diagnosis not present

## 2016-09-07 DIAGNOSIS — Z87891 Personal history of nicotine dependence: Secondary | ICD-10-CM | POA: Insufficient documentation

## 2016-09-07 DIAGNOSIS — R0602 Shortness of breath: Secondary | ICD-10-CM | POA: Diagnosis present

## 2016-09-07 MED ORDER — IPRATROPIUM-ALBUTEROL 0.5-2.5 (3) MG/3ML IN SOLN
RESPIRATORY_TRACT | Status: AC
  Administered 2016-09-07: 9 mL
  Filled 2016-09-07: qty 9

## 2016-09-07 MED ORDER — PREDNISONE 20 MG PO TABS: ORAL_TABLET | ORAL | 0 refills | Status: DC

## 2016-09-07 MED ORDER — PREDNISONE 50 MG PO TABS
60.0000 mg | ORAL_TABLET | Freq: Once | ORAL | Status: AC
  Administered 2016-09-07: 19:00:00 60 mg via ORAL
  Filled 2016-09-07: qty 1

## 2016-09-07 NOTE — ED Triage Notes (Signed)
Sob for a week. She has been using nebs and inhaler with no improvement.

## 2016-09-07 NOTE — ED Provider Notes (Signed)
[facility] Provider Note   CSN: [id number] Arrival date & time: 09/07/16  1731  By signing my name below, I, Vista Mink, attest that this documentation has been prepared under the direction and in the presence of No att. providers found. Electronically signed, Vista Mink, ED Scribe. 09/07/16. 6:39 PM.  History   Chief Complaint Chief Complaint  Patient presents with  . Shortness of Breath    HPI HPI Comments: Carrie Nguyen is a 35 y.o. female, with Hx of asthma, who presents to the Emergency Department complaining of gradually worsening shortness of breath that started one week ago. Pt reports associated wheezing as well. She has used a nebulizer and her inhaler with no relief. No exacerbating or alleviating factors noted. She reports that she did have a mild cold last week which believes may have brought on her symptoms. No fever.  The history is provided by the patient. No language interpreter was used.    Past Medical History:  Diagnosis Date  . Anemia   . Asthma     Patient Active Problem List   Diagnosis Date Noted  . Hypokalemia 04/06/2016  . Sepsis (HCC) 04/06/2016  . Asthma exacerbation 04/06/2016  . Microcytic anemia   . Asthma with status asthmaticus 04/05/2016    Past Surgical History:  Procedure Laterality Date  . TONSILLECTOMY    . TUBAL LIGATION      OB History    No data available       Home Medications    Prior to Admission medications   Medication Sig Start Date End Date Taking? Authorizing Provider  albuterol (PROVENTIL) (2.5 MG/3ML) 0.083% nebulizer solution Take 3 mLs (2.5 mg total) by nebulization 4 (four) times daily. 04/07/16  Yes Maryann Mikhail, DO  AMOXICILLIN PO Take by mouth.   Yes Historical Provider, MD  fluticasone (FLONASE) 50 MCG/ACT nasal spray Place 2 sprays into both nostrils daily. 04/07/16  Yes Maryann Mikhail, DO  MetroNIDAZOLE (FLAGYL PO) Take by mouth.   Yes Historical Provider, MD  albuterol (PROAIR HFA) 108  (90 Base) MCG/ACT inhaler Inhale 2 puffs into the lungs every 6 (six) hours as needed for wheezing or shortness of breath.    Historical Provider, MD  albuterol (PROVENTIL HFA;VENTOLIN HFA) 108 (90 Base) MCG/ACT inhaler Inhale 1-2 puffs into the lungs every 6 (six) hours as needed for wheezing or shortness of breath. 02/13/16   Vanetta Mulders, MD  azithromycin (ZITHROMAX) 250 MG tablet Take 1 tablet (250 mg total) by mouth daily. 04/07/16   Maryann Mikhail, DO  dextromethorphan-guaiFENesin (MUCINEX DM) 30-600 MG 12hr tablet Take 1 tablet by mouth 2 (two) times daily. 04/07/16   Maryann Mikhail, DO  predniSONE (DELTASONE) 20 MG tablet 2 tabs po daily x 4 days 09/07/16   Melene Plan, DO  triamcinolone ointment (KENALOG) 0.1 % Apply 1 application topically 2 (two) times daily as needed (facial eczema).    Historical Provider, MD  vitamin B-12 (CYANOCOBALAMIN) 1000 MCG tablet Take 1,000 mcg by mouth daily.    Historical Provider, MD    Family History Family History  Problem Relation Age of Onset  . Lung cancer Mother   . COPD Mother   . Asthma Mother   . Stroke Father   . Hypertension Father   . Diabetes Brother   . Kidney disease Brother     Social History Social History  Substance Use Topics  . Smoking status: Former Games developer  . Smokeless tobacco: Never Used  . Alcohol use No  Comment: occasional     Allergies   Patient has no known allergies.   Review of Systems Review of Systems  Constitutional: Negative for chills and fever.  HENT: Negative for congestion and rhinorrhea.   Eyes: Negative for redness and visual disturbance.  Respiratory: Positive for shortness of breath and wheezing.   Cardiovascular: Negative for chest pain and palpitations.  Gastrointestinal: Negative for nausea and vomiting.  Genitourinary: Negative for dysuria and urgency.  Musculoskeletal: Negative for arthralgias and myalgias.  Skin: Negative for pallor and wound.  Neurological: Negative for dizziness  and headaches.  All other systems reviewed and are negative.    Physical Exam Updated Vital Signs BP 136/79   Pulse 103   Temp 98.4 F (36.9 C) (Oral)   Resp 24   Ht 5\' 5"  (1.651 m)   Wt 203 lb (92.1 kg)   LMP 08/24/2016   SpO2 97%   BMI 33.78 kg/m   Physical Exam  Constitutional: She is oriented to person, place, and time. She appears well-developed and well-nourished. No distress.  HENT:  Head: Normocephalic and atraumatic.  Swollen turbinates. Posterior nasal drip.  Eyes: EOM are normal. Pupils are equal, round, and reactive to light.  Neck: Normal range of motion. Neck supple.  Cardiovascular: Normal rate and regular rhythm.  Exam reveals no gallop and no friction rub.   No murmur heard. Pulmonary/Chest: Effort normal. She has wheezes. She has no rales.  Diffuse, faint, expiratory wheezes.  Abdominal: Soft. She exhibits no distension. There is no tenderness.  Musculoskeletal: She exhibits no edema or tenderness.  Neurological: She is alert and oriented to person, place, and time.  Skin: Skin is warm and dry. She is not diaphoretic.  Psychiatric: She has a normal mood and affect. Her behavior is normal.  Nursing note and vitals reviewed.    ED Treatments / Results  DIAGNOSTIC STUDIES: Oxygen Saturation is 97% on RA, normal by my interpretation.  COORDINATION OF CARE: 6:39 PM-Discussed treatment plan with pt at bedside and pt agreed to plan.   Labs (all labs ordered are listed, but only abnormal results are displayed) Labs Reviewed - No data to display  EKG  EKG Interpretation None       Radiology No results found.  Procedures Procedures (including critical care time)  Medications Ordered in ED Medications  ipratropium-albuterol (DUONEB) 0.5-2.5 (3) MG/3ML nebulizer solution (9 mLs  Given 09/07/16 1740)  predniSONE (DELTASONE) tablet 60 mg (60 mg Oral Given 09/07/16 1851)     Initial Impression / Assessment and Plan / ED Course  I have reviewed  the triage vital signs and the nursing notes.  Pertinent labs & imaging results that were available during my care of the patient were reviewed by me and considered in my medical decision making (see chart for details).  Clinical Course     35 yo F With a chief complaint of an asthma exacerbation. Patient was given 3 DuoNeb back to back and steroids with significant improvement of her symptoms. She was discharged home with burst dose of steroids. PCP follow-up.    I have discussed the diagnosis/risks/treatment options with the patient and believe the pt to be eligible for discharge home to follow-up with PCP. We also discussed returning to the ED immediately if new or worsening sx occur. We discussed the sx which are most concerning (e.g., sudden worsening sob, need to use inhaler more often than every 4 hours) that necessitate immediate return. Medications administered to the patient during their visit  and any new prescriptions provided to the patient are listed below.  Medications given during this visit Medications  ipratropium-albuterol (DUONEB) 0.5-2.5 (3) MG/3ML nebulizer solution (9 mLs  Given 09/07/16 1740)  predniSONE (DELTASONE) tablet 60 mg (60 mg Oral Given 09/07/16 1851)     The patient appears reasonably screen and/or stabilized for discharge and I doubt any other medical condition or other [facility] requiring further screening, evaluation, or treatment in the ED at this time prior to discharge.    Final Clinical Impressions(s) / ED Diagnoses   Final diagnoses:  Exacerbation of asthma, unspecified asthma severity, unspecified whether persistent    New Prescriptions Discharge Medication List as of 09/07/2016  6:44 PM    I personally performed the services described in this documentation, which was scribed in my presence. The recorded information has been reviewed and is accurate.     Melene Planan Raeshawn Tafolla, DO 09/07/16 2354

## 2016-10-11 ENCOUNTER — Emergency Department (HOSPITAL_BASED_OUTPATIENT_CLINIC_OR_DEPARTMENT_OTHER)
Admission: EM | Admit: 2016-10-11 | Discharge: 2016-10-11 | Disposition: A | Discharge status: Home / Self Care | Payer: 59 | Attending: Emergency Medicine | Admitting: Emergency Medicine

## 2016-10-11 ENCOUNTER — Encounter (HOSPITAL_BASED_OUTPATIENT_CLINIC_OR_DEPARTMENT_OTHER): Payer: Self-pay | Admitting: *Deleted

## 2016-10-11 DIAGNOSIS — Z87891 Personal history of nicotine dependence: Secondary | ICD-10-CM | POA: Insufficient documentation

## 2016-10-11 DIAGNOSIS — J45901 Unspecified asthma with (acute) exacerbation: Secondary | ICD-10-CM | POA: Diagnosis not present

## 2016-10-11 DIAGNOSIS — R0602 Shortness of breath: Secondary | ICD-10-CM | POA: Diagnosis present

## 2016-10-11 MED ORDER — ALBUTEROL SULFATE (2.5 MG/3ML) 0.083% IN NEBU
5.0000 mg | INHALATION_SOLUTION | Freq: Once | RESPIRATORY_TRACT | Status: AC
  Administered 2016-10-11: 5 mg via RESPIRATORY_TRACT
  Filled 2016-10-11: qty 6

## 2016-10-11 MED ORDER — ALBUTEROL (5 MG/ML) CONTINUOUS INHALATION SOLN
10.0000 mg/h | INHALATION_SOLUTION | RESPIRATORY_TRACT | Status: DC
  Administered 2016-10-11: 10 mg/h via RESPIRATORY_TRACT
  Filled 2016-10-11: qty 20

## 2016-10-11 MED ORDER — PREDNISONE 10 MG PO TABS: 40.0000 mg | ORAL_TABLET | Freq: Every day | ORAL | 0 refills | Status: DC

## 2016-10-11 MED ORDER — IPRATROPIUM BROMIDE 0.02 % IN SOLN
0.5000 mg | Freq: Once | RESPIRATORY_TRACT | Status: AC
  Administered 2016-10-11: 0.5 mg via RESPIRATORY_TRACT
  Filled 2016-10-11: qty 2.5

## 2016-10-11 MED ORDER — PREDNISONE 20 MG PO TABS
40.0000 mg | ORAL_TABLET | Freq: Once | ORAL | Status: AC
  Administered 2016-10-11: 40 mg via ORAL
  Filled 2016-10-11: qty 2

## 2016-10-11 MED ORDER — ALBUTEROL SULFATE HFA 108 (90 BASE) MCG/ACT IN AERS: 1.0000 | INHALATION_SPRAY | RESPIRATORY_TRACT | 0 refills | Status: DC | PRN

## 2016-10-11 NOTE — ED Triage Notes (Addendum)
Pt c/o SOB hx asthma out of meds, Admit x 2 months ago for same

## 2016-10-11 NOTE — ED Notes (Signed)
Ambulated around nurses station, tol well, O2 sat 100% RA

## 2016-10-11 NOTE — ED Notes (Signed)
ED Provider at bedside. 

## 2016-10-11 NOTE — ED Provider Notes (Signed)
[facility] Provider Note   CSN: [id number] Arrival date & time: 10/11/16  1152     History   Chief Complaint Chief Complaint  Patient presents with  . Asthma    HPI Carrie Nguyen is a 36 y.o. female.  The history is provided by the patient. No language interpreter was used.  Asthma    Carrie Nguyen is a 36 y.o. female who presents to the Emergency Department complaining of sob.  She has a history of asthma and reports increased shortness of breath and chest tightness since last night. She is out of her MDI but she is using her nebulizer machine at home. She has cough productive of clear sputum, chest tightness. No fevers, vomiting, abdominal pain, leg swelling or pain. She has a history of asthma and was admitted 2 months ago for similar symptoms. No known sick contacts. Past Medical History:  Diagnosis Date  . Anemia   . Asthma     Patient Active Problem List   Diagnosis Date Noted  . Hypokalemia 04/06/2016  . Sepsis (HCC) 04/06/2016  . Asthma exacerbation 04/06/2016  . Microcytic anemia   . Asthma with status asthmaticus 04/05/2016    Past Surgical History:  Procedure Laterality Date  . TONSILLECTOMY    . TUBAL LIGATION      OB History    No data available       Home Medications    Prior to Admission medications   Medication Sig Start Date End Date Taking? Authorizing Provider  albuterol (PROVENTIL HFA;VENTOLIN HFA) 108 (90 Base) MCG/ACT inhaler Inhale 1-2 puffs into the lungs every 4 (four) hours as needed for wheezing or shortness of breath. 10/11/16   Tilden FossaElizabeth Starlin Steib, MD  albuterol (PROVENTIL) (2.5 MG/3ML) 0.083% nebulizer solution Take 3 mLs (2.5 mg total) by nebulization 4 (four) times daily. 04/07/16   Maryann Mikhail, DO  AMOXICILLIN PO Take by mouth.    Historical Provider, MD  dextromethorphan-guaiFENesin (MUCINEX DM) 30-600 MG 12hr tablet Take 1 tablet by mouth 2 (two) times daily. 04/07/16   Maryann Mikhail, DO  fluticasone (FLONASE) 50  MCG/ACT nasal spray Place 2 sprays into both nostrils daily. 04/07/16   Maryann Mikhail, DO  MetroNIDAZOLE (FLAGYL PO) Take by mouth.    Historical Provider, MD  predniSONE (DELTASONE) 10 MG tablet Take 4 tablets (40 mg total) by mouth daily. 10/11/16   Tilden FossaElizabeth Sister Carbone, MD  triamcinolone ointment (KENALOG) 0.1 % Apply 1 application topically 2 (two) times daily as needed (facial eczema).    Historical Provider, MD  vitamin B-12 (CYANOCOBALAMIN) 1000 MCG tablet Take 1,000 mcg by mouth daily.    Historical Provider, MD    Family History Family History  Problem Relation Age of Onset  . Lung cancer Mother   . COPD Mother   . Asthma Mother   . Stroke Father   . Hypertension Father   . Diabetes Brother   . Kidney disease Brother     Social History Social History  Substance Use Topics  . Smoking status: Former Games developermoker  . Smokeless tobacco: Never Used  . Alcohol use No     Comment: occasional     Allergies   Patient has no known allergies.   Review of Systems Review of Systems  All other systems reviewed and are negative.    Physical Exam Updated Vital Signs BP 116/67 (BP Location: Right Arm)   Pulse 108   Temp 98.1 F (36.7 C)   Resp 18   Ht 5\' 5"  (1.651  m)   Wt 203 lb (92.1 kg)   LMP 09/27/2016   SpO2 96%   BMI 33.78 kg/m   Physical Exam  Constitutional: She is oriented to person, place, and time. She appears well-developed and well-nourished.  HENT:  Head: Normocephalic and atraumatic.  Cardiovascular: Regular rhythm.   No murmur heard. Tachycardic  Pulmonary/Chest: Effort normal. No respiratory distress.  Tachypnea with diffuse wheezes bilaterally, speaks in full sentences. No accessory muscle use.  Abdominal: Soft. There is no tenderness. There is no rebound and no guarding.  Musculoskeletal: She exhibits no edema or tenderness.  Neurological: She is alert and oriented to person, place, and time.  Skin: Skin is warm and dry.  Psychiatric: She has a normal  mood and affect. Her behavior is normal.  Nursing note and vitals reviewed.    ED Treatments / Results  Labs (all labs ordered are listed, but only abnormal results are displayed) Labs Reviewed - No data to display  EKG  EKG Interpretation None       Radiology No results found.  Procedures Procedures (including critical care time)  Medications Ordered in ED Medications  albuterol (PROVENTIL) (2.5 MG/3ML) 0.083% nebulizer solution 5 mg (5 mg Nebulization Given 10/11/16 1203)  predniSONE (DELTASONE) tablet 40 mg (40 mg Oral Given 10/11/16 1232)  ipratropium (ATROVENT) nebulizer solution 0.5 mg (0.5 mg Nebulization Given 10/11/16 1246)     Initial Impression / Assessment and Plan / ED Course  I have reviewed the triage vital signs and the nursing notes.  Pertinent labs & imaging results that were available during my care of the patient were reviewed by me and considered in my medical decision making (see chart for details).     Patient with history of asthma here with increasing shortness of breath, out of her albuterol MDI at home. Following treatment in the emergency department she is significantly improved. She does have persistent occasional end expiratory wheezes but states she feels as if she is at her baseline. There is no clinical evidence of pneumonia or complicating features. Will discharge home with steroid course, albuterol MDI as well as her continued albuterol nebulizer treatment home. Close outpatient follow-up and return precautions discussed.  Final Clinical Impressions(s) / ED Diagnoses   Final diagnoses:  Exacerbation of asthma, unspecified asthma severity, unspecified whether persistent    New Prescriptions Discharge Medication List as of 10/11/2016  2:30 PM    START taking these medications   Details  predniSONE (DELTASONE) 10 MG tablet Take 4 tablets (40 mg total) by mouth daily., Starting Sun 10/11/2016, Print         Tilden Fossa, MD 10/12/16  1208

## 2016-11-23 ENCOUNTER — Encounter (HOSPITAL_BASED_OUTPATIENT_CLINIC_OR_DEPARTMENT_OTHER): Payer: Self-pay | Admitting: Emergency Medicine

## 2016-11-23 ENCOUNTER — Emergency Department (HOSPITAL_BASED_OUTPATIENT_CLINIC_OR_DEPARTMENT_OTHER)
Admission: EM | Admit: 2016-11-23 | Discharge: 2016-11-23 | Disposition: A | Discharge status: Home / Self Care | Payer: 59 | Attending: Emergency Medicine | Admitting: Emergency Medicine

## 2016-11-23 DIAGNOSIS — Z87891 Personal history of nicotine dependence: Secondary | ICD-10-CM | POA: Diagnosis not present

## 2016-11-23 DIAGNOSIS — J45909 Unspecified asthma, uncomplicated: Secondary | ICD-10-CM | POA: Insufficient documentation

## 2016-11-23 DIAGNOSIS — J45901 Unspecified asthma with (acute) exacerbation: Secondary | ICD-10-CM

## 2016-11-23 DIAGNOSIS — R0602 Shortness of breath: Secondary | ICD-10-CM | POA: Diagnosis present

## 2016-11-23 MED ORDER — PREDNISONE 10 MG PO TABS: 40.0000 mg | ORAL_TABLET | Freq: Every day | ORAL | 0 refills | Status: DC

## 2016-11-23 MED ORDER — PREDNISONE 20 MG PO TABS
40.0000 mg | ORAL_TABLET | Freq: Once | ORAL | Status: AC
Start: 2016-11-23 — End: 2016-11-23
  Administered 2016-11-23: 40 mg via ORAL
  Filled 2016-11-23: qty 2

## 2016-11-23 MED ORDER — ALBUTEROL (5 MG/ML) CONTINUOUS INHALATION SOLN
INHALATION_SOLUTION | RESPIRATORY_TRACT | Status: AC
  Administered 2016-11-23: 10 mg/h
  Filled 2016-11-23: qty 20

## 2016-11-23 MED ORDER — ALBUTEROL SULFATE (2.5 MG/3ML) 0.083% IN NEBU
5.0000 mg | INHALATION_SOLUTION | Freq: Once | RESPIRATORY_TRACT | Status: AC
  Administered 2016-11-23: 5 mg via RESPIRATORY_TRACT

## 2016-11-23 MED ORDER — ALBUTEROL SULFATE HFA 108 (90 BASE) MCG/ACT IN AERS
2.0000 | INHALATION_SPRAY | Freq: Once | RESPIRATORY_TRACT | Status: AC
  Administered 2016-11-23: 2 via RESPIRATORY_TRACT
  Filled 2016-11-23: qty 6.7

## 2016-11-23 MED ORDER — ALBUTEROL SULFATE (2.5 MG/3ML) 0.083% IN NEBU
INHALATION_SOLUTION | RESPIRATORY_TRACT | Status: AC
  Filled 2016-11-23: qty 6

## 2016-11-23 MED ORDER — ALBUTEROL SULFATE HFA 108 (90 BASE) MCG/ACT IN AERS: 1.0000 | INHALATION_SPRAY | Freq: Four times a day (QID) | RESPIRATORY_TRACT | 0 refills | Status: DC | PRN

## 2016-11-23 NOTE — ED Triage Notes (Signed)
Pt ran out of her inhaler two days ago.  Pt does have nebulizer and has had to use it more than usual.  Some sob with exp wheezes.

## 2016-11-23 NOTE — ED Provider Notes (Signed)
[facility]   CSN: [id number] Arrival date & time: 11/23/16  1729   By signing my name below, I, [name], attest that this documentation has been prepared under the direction and in the presence of Tilden Fossa, MD  Electronically Signed: [name], ED Scribe. 11/23/16. 6:55 PM.   History   Chief Complaint Chief Complaint  Patient presents with  . Shortness of Breath   The history is provided by the patient. No language interpreter was used.   HPI Comments:  Carrie Nguyen is a 36 y.o. female, with a hx of asthma, who presents to the Emergency Department complaining of acute onset SOB onset today. Pt states her symptoms are similar to the symptoms she experiences with her asthma exacerbations. Pt notes she ran out of her albuterol inhaler 2 days ago. She has been using her at more nebulizer treatment more frequently with no significant relief. Pt denies fevers, leg swelling, cough, hemoptysis, a chance of being pregnant or any other associated symptoms. No other complaints noted.   Past Medical History:  Diagnosis Date  . Anemia   . Asthma     Patient Active Problem List   Diagnosis Date Noted  . Hypokalemia 04/06/2016  . Sepsis (HCC) 04/06/2016  . Asthma exacerbation 04/06/2016  . Microcytic anemia   . Asthma with status asthmaticus 04/05/2016    Past Surgical History:  Procedure Laterality Date  . TONSILLECTOMY    . TUBAL LIGATION      OB History    No data available       Home Medications    Prior to Admission medications   Medication Sig Start Date End Date Taking? Authorizing Provider  albuterol (PROVENTIL HFA;VENTOLIN HFA) 108 (90 Base) MCG/ACT inhaler Inhale 1-2 puffs into the lungs every 4 (four) hours as needed for wheezing or shortness of breath. 10/11/16   Tilden Fossa, MD  albuterol (PROVENTIL HFA;VENTOLIN HFA) 108 (90 Base) MCG/ACT inhaler Inhale 1-2 puffs into the lungs every 6 (six) hours as needed for wheezing or  shortness of breath. 11/23/16   Tilden Fossa, MD  albuterol (PROVENTIL) (2.5 MG/3ML) 0.083% nebulizer solution Take 3 mLs (2.5 mg total) by nebulization 4 (four) times daily. 04/07/16   Maryann Mikhail, DO  dextromethorphan-guaiFENesin (MUCINEX DM) 30-600 MG 12hr tablet Take 1 tablet by mouth 2 (two) times daily. 04/07/16   Maryann Mikhail, DO  fluticasone (FLONASE) 50 MCG/ACT nasal spray Place 2 sprays into both nostrils daily. 04/07/16   Maryann Mikhail, DO  predniSONE (DELTASONE) 10 MG tablet Take 4 tablets (40 mg total) by mouth daily. 11/23/16   Tilden Fossa, MD  vitamin B-12 (CYANOCOBALAMIN) 1000 MCG tablet Take 1,000 mcg by mouth daily.    Historical Provider, MD    Family History Family History  Problem Relation Age of Onset  . Lung cancer Mother   . COPD Mother   . Asthma Mother   . Stroke Father   . Hypertension Father   . Diabetes Brother   . Kidney disease Brother     Social History Social History  Substance Use Topics  . Smoking status: Former Games developer  . Smokeless tobacco: Never Used  . Alcohol use No     Comment: occasional     Allergies   Patient has no known allergies.   Review of Systems Review of Systems  Constitutional: Negative for fever.  Respiratory: Positive for shortness of breath. Negative for cough.   Cardiovascular: Negative for leg swelling.  All other systems reviewed  and are negative.    Physical Exam Updated Vital Signs BP 127/80 (BP Location: Right Arm)   Pulse 112   Temp 98.3 F (36.8 C) (Oral)   Resp 18   Ht 5\' 5"  (1.651 m)   Wt 190 lb (86.2 kg)   LMP 11/16/2016   SpO2 94%   BMI 31.62 kg/m   Physical Exam  Constitutional: She is oriented to person, place, and time. She appears well-developed and well-nourished.  HENT:  Head: Normocephalic and atraumatic.  Cardiovascular: Regular rhythm.  Tachycardia present.   No murmur heard. Pulmonary/Chest: Effort normal. No respiratory distress. She has wheezes.  Diffuse expiratory  wheezes  Abdominal: Soft. There is no tenderness. There is no rebound and no guarding.  Musculoskeletal: She exhibits no edema or tenderness.  Neurological: She is alert and oriented to person, place, and time.  Skin: Skin is warm and dry.  Psychiatric: She has a normal mood and affect. Her behavior is normal.  Nursing note and vitals reviewed.   ED Treatments / Results  DIAGNOSTIC STUDIES:  Oxygen Saturation is 95% on RA, normal by my interpretation.    COORDINATION OF CARE:  6:52 PM Discussed treatment plan with pt at bedside and pt agreed to plan.  Labs (all labs ordered are listed, but only abnormal results are displayed) Labs Reviewed - No data to display  EKG  EKG Interpretation  Date/Time:  Monday November 23 2016 17:41:04 EST Ventricular Rate:  101 PR Interval:  152 QRS Duration: 72 QT Interval:  332 QTC Calculation: 430 R Axis:   64 Text Interpretation:  Sinus tachycardia Nonspecific T wave abnormality Abnormal ECG Confirmed by Lincoln Brigham [phone]) on 11/23/2016 7:03:23 PM       Radiology No results found.  Procedures Procedures (including critical care time)  Medications Ordered in ED Medications  albuterol (PROVENTIL) (2.5 MG/3ML) 0.083% nebulizer solution 5 mg (5 mg Nebulization Given 11/23/16 1746)  albuterol (PROVENTIL, VENTOLIN) (5 MG/ML) 0.5% continuous inhalation solution (10 mg/hr  Given 11/23/16 1837)  predniSONE (DELTASONE) tablet 40 mg (40 mg Oral Given 11/23/16 1927)  albuterol (PROVENTIL HFA;VENTOLIN HFA) 108 (90 Base) MCG/ACT inhaler 2 puff (2 puffs Inhalation Given 11/23/16 2145)     Initial Impression / Assessment and Plan / ED Course  I have reviewed the triage vital signs and the nursing notes.  Pertinent labs & imaging results that were available during my care of the patient were reviewed by me and considered in my medical decision making (see chart for details).     Patient with history of asthma here with increased shortness of breath, out of  her inhaler for the last 2 days. Initial evaluation patient with tachypnea, tachycardia, wheezes. Following albuterol treatments and repeat assessment she is feeling significantly improved. She does have persistent occasional end expiratory wheezes but shortness of breath and tachypnea have resolved. Current clinical picture is not consistent with pneumonia, pneumothorax, status asthmaticus.  Counseled pt on home care for asthma exacerbation, outpatient follow up and returnp recautions.    Final Clinical Impressions(s) / ED Diagnoses   Final diagnoses:  Exacerbation of asthma, unspecified asthma severity, unspecified whether persistent    New Prescriptions Discharge Medication List as of 11/23/2016  9:42 PM    START taking these medications   Details  !! albuterol (PROVENTIL HFA;VENTOLIN HFA) 108 (90 Base) MCG/ACT inhaler Inhale 1-2 puffs into the lungs every 6 (six) hours as needed for wheezing or shortness of breath., Starting Mon 11/23/2016, Print    predniSONE (DELTASONE)  10 MG tablet Take 4 tablets (40 mg total) by mouth daily., Starting Mon 11/23/2016, Print     !! - Potential duplicate medications found. Please discuss with provider.    I personally performed the services described in this documentation, which was scribed in my presence. The recorded information has been reviewed and is accurate.    Tilden FossaElizabeth Graceyn Fodor, MD 11/24/16 [phone]

## 2016-11-23 NOTE — ED Notes (Signed)
Last HHN thia am before work, out of rescue HFA x2 days.  BBS with prolong exhalation, diffuse exp wheezes t/o. SpO2 100 on r/a

## 2016-11-23 NOTE — ED Notes (Signed)
ED Provider at bedside. 

## 2016-11-24 ENCOUNTER — Telehealth (HOSPITAL_BASED_OUTPATIENT_CLINIC_OR_DEPARTMENT_OTHER): Payer: Self-pay

## 2016-11-24 MED ORDER — OMEPRAZOLE 20 MG PO CPDR: 20.0000 mg | DELAYED_RELEASE_CAPSULE | Freq: Every day | ORAL | 0 refills | Status: DC

## 2016-11-24 NOTE — Telephone Encounter (Signed)
Pt called to state EDP advised during ED visit yesterday that she would give rx med for reflux- EDP Madilyn Hookees notified-rx for prilosec printed-pt was notified rx would be at reg-states she will pick up rx tomorrow

## 2016-11-26 MED ORDER — ESOMEPRAZOLE MAGNESIUM 20 MG PO CPDR: 20.0000 mg | DELAYED_RELEASE_CAPSULE | Freq: Every day | ORAL | 0 refills | Status: DC

## 2016-11-26 NOTE — ED Provider Notes (Signed)
Patient called 3 days after discharge because her insurance would not cover prilosec. I wrote a prescription for nexium instead.   Lyndal Pulleyaniel Camber Ninh, MD 11/26/16 [phone]

## 2016-12-07 ENCOUNTER — Encounter (HOSPITAL_BASED_OUTPATIENT_CLINIC_OR_DEPARTMENT_OTHER): Payer: Self-pay

## 2016-12-07 ENCOUNTER — Emergency Department (HOSPITAL_BASED_OUTPATIENT_CLINIC_OR_DEPARTMENT_OTHER)
Admission: EM | Admit: 2016-12-07 | Discharge: 2016-12-07 | Disposition: A | Discharge status: Home / Self Care | Payer: 59 | Attending: Emergency Medicine | Admitting: Emergency Medicine

## 2016-12-07 DIAGNOSIS — Z87891 Personal history of nicotine dependence: Secondary | ICD-10-CM | POA: Insufficient documentation

## 2016-12-07 DIAGNOSIS — Z79899 Other long term (current) drug therapy: Secondary | ICD-10-CM | POA: Diagnosis not present

## 2016-12-07 DIAGNOSIS — J45901 Unspecified asthma with (acute) exacerbation: Secondary | ICD-10-CM | POA: Insufficient documentation

## 2016-12-07 DIAGNOSIS — J45909 Unspecified asthma, uncomplicated: Secondary | ICD-10-CM | POA: Diagnosis not present

## 2016-12-07 DIAGNOSIS — R0602 Shortness of breath: Secondary | ICD-10-CM | POA: Diagnosis present

## 2016-12-07 MED ORDER — ALBUTEROL SULFATE (2.5 MG/3ML) 0.083% IN NEBU
INHALATION_SOLUTION | RESPIRATORY_TRACT | Status: AC
  Administered 2016-12-07: 2.5 mg
  Filled 2016-12-07: qty 3

## 2016-12-07 MED ORDER — IPRATROPIUM-ALBUTEROL 0.5-2.5 (3) MG/3ML IN SOLN
3.0000 mL | Freq: Once | RESPIRATORY_TRACT | Status: AC
  Administered 2016-12-07: 3 mL via RESPIRATORY_TRACT

## 2016-12-07 MED ORDER — IPRATROPIUM-ALBUTEROL 0.5-2.5 (3) MG/3ML IN SOLN
RESPIRATORY_TRACT | Status: AC
  Filled 2016-12-07: qty 3

## 2016-12-07 MED ORDER — DEXAMETHASONE 6 MG PO TABS
10.0000 mg | ORAL_TABLET | Freq: Once | ORAL | Status: AC
  Administered 2016-12-07: 19:00:00 10 mg via ORAL
  Filled 2016-12-07: qty 1

## 2016-12-07 MED ORDER — ALBUTEROL (5 MG/ML) CONTINUOUS INHALATION SOLN
10.0000 mg/h | INHALATION_SOLUTION | RESPIRATORY_TRACT | Status: DC
  Administered 2016-12-07: 10 mg/h via RESPIRATORY_TRACT
  Filled 2016-12-07: qty 20

## 2016-12-07 MED ORDER — ALBUTEROL (5 MG/ML) CONTINUOUS INHALATION SOLN: 5.0000 mg/h | INHALATION_SOLUTION | Freq: Once | RESPIRATORY_TRACT | Status: DC

## 2016-12-07 MED ORDER — PREDNISONE 20 MG PO TABS: 40.0000 mg | ORAL_TABLET | Freq: Every day | ORAL | 0 refills | Status: DC

## 2016-12-07 NOTE — ED Provider Notes (Signed)
[facility]   CSN: [id number] Arrival date & time: 12/07/16  1726   By signing my name below, I, Avnee Patel, attest that this documentation has been prepared under the direction and in the presence of  [employer]. Electronically Signed: [name], ED Scribe. 12/07/16. 6:30 PM.  History   Chief Complaint Chief Complaint  Patient presents with  . Shortness of Breath   The history is provided by the patient. No language interpreter was used.   HPI Comments:  Carrie Nguyen is a 36 y.o. female, with a PMHx of asthma with hospitalizations, who presents to the Emergency Department complaining of acute onset, gradually worsening SOB starting today. She also reports associated wheezing. Pt states her symptoms began after she was exposed to an oven cleaner. Pt has used her inhaler and at home breathing treatments with no relief. Pt denies fevers, nausea, vomiting, chest pain, cold symptoms or any other associated symptoms. No other complaints noted.   Past Medical History:  Diagnosis Date  . Anemia   . Asthma     Patient Active Problem List   Diagnosis Date Noted  . Hypokalemia 04/06/2016  . Sepsis (HCC) 04/06/2016  . Asthma exacerbation 04/06/2016  . Microcytic anemia   . Asthma with status asthmaticus 04/05/2016    Past Surgical History:  Procedure Laterality Date  . TONSILLECTOMY    . TUBAL LIGATION      OB History    No data available       Home Medications    Prior to Admission medications   Medication Sig Start Date End Date Taking? Authorizing Provider  albuterol (PROVENTIL HFA;VENTOLIN HFA) 108 (90 Base) MCG/ACT inhaler Inhale 1-2 puffs into the lungs every 4 (four) hours as needed for wheezing or shortness of breath. 10/11/16  Yes Tilden Fossa, MD  albuterol (PROVENTIL HFA;VENTOLIN HFA) 108 (90 Base) MCG/ACT inhaler Inhale 1-2 puffs into the lungs every 6 (six) hours as needed for wheezing or shortness of breath. 11/23/16  Yes  Tilden Fossa, MD  albuterol (PROVENTIL) (2.5 MG/3ML) 0.083% nebulizer solution Take 3 mLs (2.5 mg total) by nebulization 4 (four) times daily. 04/07/16  Yes Maryann Mikhail, DO  dextromethorphan-guaiFENesin (MUCINEX DM) 30-600 MG 12hr tablet Take 1 tablet by mouth 2 (two) times daily. 04/07/16  Yes Maryann Mikhail, DO  vitamin B-12 (CYANOCOBALAMIN) 1000 MCG tablet Take 1,000 mcg by mouth daily.   Yes Historical Provider, MD  esomeprazole (NEXIUM) 20 MG capsule Take 1 capsule (20 mg total) by mouth daily. 11/26/16   Lyndal Pulley, MD  fluticasone [facility]) 50 MCG/ACT nasal spray Place 2 sprays into both nostrils daily. 04/07/16   Maryann Mikhail, DO  predniSONE (DELTASONE) 10 MG tablet Take 4 tablets (40 mg total) by mouth daily. 11/23/16   Tilden Fossa, MD    Family History Family History  Problem Relation Age of Onset  . Lung cancer Mother   . COPD Mother   . Asthma Mother   . Stroke Father   . Hypertension Father   . Diabetes Brother   . Kidney disease Brother     Social History Social History  Substance Use Topics  . Smoking status: Former Games developer  . Smokeless tobacco: Never Used  . Alcohol use No     Comment: occasional     Allergies   Patient has no known allergies.   Review of Systems Review of Systems  Constitutional: Negative for fever.  HENT: Negative for rhinorrhea and sore throat.   Eyes: Negative  for redness.  Respiratory: Positive for shortness of breath and wheezing. Negative for cough.   Cardiovascular: Negative for chest pain.  Gastrointestinal: Negative for abdominal pain, diarrhea, nausea and vomiting.  Genitourinary: Negative for dysuria.  Musculoskeletal: Negative for myalgias.  Skin: Negative for rash.  Neurological: Negative for headaches.   Physical Exam Updated Vital Signs BP 111/69 (BP Location: Right Arm)   Pulse (!) 112   Temp 98.9 F (37.2 C) (Oral)   Ht 5\' 5"  (1.651 m)   Wt 190 lb (86.2 kg)   LMP 11/16/2016   SpO2 100%   BMI 31.62  kg/m   Physical Exam  Constitutional: She is oriented to person, place, and time. She appears well-developed and well-nourished. No distress.  HENT:  Head: Normocephalic and atraumatic.  Mouth/Throat: Oropharynx is clear and moist.  Eyes: Conjunctivae are normal. Right eye exhibits no discharge. Left eye exhibits no discharge.  Neck: Normal range of motion. Neck supple.  Cardiovascular: Regular rhythm and normal heart sounds.  Tachycardia present.   Pulmonary/Chest: Effort normal. No respiratory distress. She has wheezes (Moderate respiratory/expiratory wheezing in all fields). She has no rales.  No accessory muscle use.  Abdominal: Soft. She exhibits no distension. There is no tenderness.  Neurological: She is alert and oriented to person, place, and time.  Skin: Skin is warm and dry.  Psychiatric: She has a normal mood and affect.  Nursing note and vitals reviewed.    ED Treatments / Results  DIAGNOSTIC STUDIES:  Oxygen Saturation is 100% on RA, normal by my interpretation.    COORDINATION OF CARE:  6:28 PM Will continue albuterol nebulizer treatment. Discussed treatment plan with pt at bedside and pt agreed to plan.   Procedures Procedures (including critical care time)  Medications Ordered in ED Medications  albuterol (PROVENTIL,VENTOLIN) solution continuous neb (0 mg/hr Nebulization Stopped 12/07/16 1942)  ipratropium-albuterol (DUONEB) 0.5-2.5 (3) MG/3ML nebulizer solution 3 mL (3 mLs Nebulization Given 12/07/16 1738)  albuterol (PROVENTIL) (2.5 MG/3ML) 0.083% nebulizer solution (2.5 mg  Given 12/07/16 1738)  dexamethasone (DECADRON) tablet 10 mg (10 mg Oral Given 12/07/16 1834)     Initial Impression / Assessment and Plan / ED Course  I have reviewed the triage vital signs and the nursing notes.  Pertinent labs & imaging results that were available during my care of the patient were reviewed by me and considered in my medical decision making (see chart for  details).       Vital signs reviewed and are as follows: BP 127/76 (BP Location: Right Arm)   Pulse (!) 114   Temp 98.9 F (37.2 C) (Oral)   Ht 5\' 5"  (1.651 m)   Wt 86.2 kg   LMP 11/16/2016   SpO2 100%   BMI 31.62 kg/m   7:44 PM patient feels much better after hour-long nebulizer treatment. I offered additional treatments, she declines and states that she wants to go home. She will use her albuterol nebulizer every 4 hours for the next 24 hours. She will take 4 additional days of prednisone. On exam, patient has some residual end expiratory wheezing, but is much improved from arrival. She remains mildly tachycardic. Her oxygen saturation is 95-96% on room air.  Encourage patient to return to the emergency department with worsening symptoms or other concerns.   Final Clinical Impressions(s) / ED Diagnoses   Final diagnoses:  Exacerbation of asthma, unspecified asthma severity, unspecified whether persistent   Patient with exacerbation of her typical asthma symptoms after being exposed to over  cleaner. She is feeling much better. She would like to go home. No associated infectious etiology noted. Patient appears well and is in no respiratory distress and has no accessory muscle use.  New Prescriptions Discharge Medication List as of 12/07/2016  7:28 PM    I personally performed the services described in this documentation, which was scribed in my presence. The recorded information has been reviewed and is accurate.     Renne Crigler, PA-C 12/07/16 1946    Tilden Fossa, MD 12/10/16 [phone]

## 2016-12-07 NOTE — Discharge Instructions (Signed)
Please read and follow all provided instructions.  Your diagnoses today include:  1. Exacerbation of asthma, unspecified asthma severity, unspecified whether persistent    Tests performed today include:  Vital signs. See below for your results today.   Medications prescribed:   Prednisone - steroid medicine   It is best to take this medication in the morning to prevent sleeping problems. If you are diabetic, monitor your blood sugar closely and stop taking Prednisone if blood sugar is over 300. Take with food to prevent stomach upset.   Take any prescribed medications only as directed.  Home care instructions:  Follow any educational materials contained in this packet.  Follow-up instructions: Please follow-up with your primary care provider in the next 3 days for further evaluation of your symptoms and management of your asthma.  Return instructions:   Please return to the Emergency Department if you experience worsening symptoms.  Please return with worsening wheezing, shortness of breath, or difficulty breathing.  Return with persistent fever above 101F.   Please return if you have any other emergent concerns.  Additional Information:  Your vital signs today were: BP 127/76 (BP Location: Right Arm)    Pulse (!) 114    Temp 98.9 F (37.2 C) (Oral)    Ht 5\' 5"  (1.651 m)    Wt 86.2 kg    LMP 11/16/2016    SpO2 100%    BMI 31.62 kg/m  If your blood pressure (BP) was elevated above 135/85 this visit, please have this repeated by your doctor within one month. --------------

## 2016-12-07 NOTE — ED Triage Notes (Signed)
Pt states she has had wheezing, shortness of breath since yesterday, known asthma with exposure to oven cleaner yesterday. Home nebs ineffective x3.

## 2017-01-06 ENCOUNTER — Encounter (HOSPITAL_BASED_OUTPATIENT_CLINIC_OR_DEPARTMENT_OTHER): Payer: Self-pay

## 2017-01-06 ENCOUNTER — Emergency Department (HOSPITAL_BASED_OUTPATIENT_CLINIC_OR_DEPARTMENT_OTHER)
Admission: EM | Admit: 2017-01-06 | Discharge: 2017-01-06 | Disposition: A | Discharge status: Home / Self Care | Payer: 59 | Attending: Emergency Medicine | Admitting: Emergency Medicine

## 2017-01-06 DIAGNOSIS — R0602 Shortness of breath: Secondary | ICD-10-CM | POA: Diagnosis present

## 2017-01-06 DIAGNOSIS — Z79899 Other long term (current) drug therapy: Secondary | ICD-10-CM | POA: Diagnosis not present

## 2017-01-06 DIAGNOSIS — J45901 Unspecified asthma with (acute) exacerbation: Secondary | ICD-10-CM | POA: Diagnosis not present

## 2017-01-06 DIAGNOSIS — Z87891 Personal history of nicotine dependence: Secondary | ICD-10-CM | POA: Insufficient documentation

## 2017-01-06 MED ORDER — ALBUTEROL SULFATE (2.5 MG/3ML) 0.083% IN NEBU
INHALATION_SOLUTION | RESPIRATORY_TRACT | Status: AC
  Administered 2017-01-06: 2.5 mg
  Filled 2017-01-06: qty 3

## 2017-01-06 MED ORDER — PREDNISONE 20 MG PO TABS: 40.0000 mg | ORAL_TABLET | Freq: Every day | ORAL | 0 refills | Status: DC

## 2017-01-06 MED ORDER — ALBUTEROL SULFATE (2.5 MG/3ML) 0.083% IN NEBU
5.0000 mg | INHALATION_SOLUTION | Freq: Once | RESPIRATORY_TRACT | Status: AC
  Administered 2017-01-06: 5 mg via RESPIRATORY_TRACT
  Filled 2017-01-06: qty 6

## 2017-01-06 MED ORDER — ALBUTEROL SULFATE HFA 108 (90 BASE) MCG/ACT IN AERS
2.0000 | INHALATION_SPRAY | RESPIRATORY_TRACT | Status: DC | PRN
  Administered 2017-01-06: 2 via RESPIRATORY_TRACT
  Filled 2017-01-06: qty 6.7

## 2017-01-06 MED ORDER — IPRATROPIUM BROMIDE 0.02 % IN SOLN
0.5000 mg | Freq: Once | RESPIRATORY_TRACT | Status: AC
  Administered 2017-01-06: 0.5 mg via RESPIRATORY_TRACT
  Filled 2017-01-06: qty 2.5

## 2017-01-06 MED ORDER — PREDNISONE 50 MG PO TABS
60.0000 mg | ORAL_TABLET | Freq: Once | ORAL | Status: AC
  Administered 2017-01-06: 16:00:00 60 mg via ORAL
  Filled 2017-01-06: qty 1

## 2017-01-06 MED ORDER — IPRATROPIUM-ALBUTEROL 0.5-2.5 (3) MG/3ML IN SOLN
RESPIRATORY_TRACT | Status: AC
  Administered 2017-01-06: 3 mL
  Filled 2017-01-06: qty 3

## 2017-01-06 MED FILL — predniSONE 20 MG TABS: 20 | 4 days supply | Qty: 8 | Fill #0

## 2017-01-06 NOTE — ED Triage Notes (Signed)
c/o SOB x 2-3 days "asthma"-last used inhaler last night/ran out

## 2017-01-06 NOTE — ED Provider Notes (Signed)
[facility]   CSN: [id number] Arrival date & time: 01/06/17  1522     History   Chief Complaint Chief Complaint  Patient presents with  . Shortness of Breath    HPI Carrie Nguyen is a 36 y.o. female.  HPI Patient with history of asthma presents with shortness of breath and wheezing for last 2-3 days. States she always gets bad this time year due to the change in weather. Occasional cough. No fevers. Slight pain in her left lower chest. No swelling or legs. Last hospital pregnancy. She been using her inhaler and albuterol nebulizer home. States she ran out of the inhaler last night. Occasional cough without sputum production. Has an appointment on Friday with her primary care doctor.   Past Medical History:  Diagnosis Date  . Anemia   . Asthma     Patient Active Problem List   Diagnosis Date Noted  . Hypokalemia 04/06/2016  . Sepsis (HCC) 04/06/2016  . Asthma exacerbation 04/06/2016  . Microcytic anemia   . Asthma with status asthmaticus 04/05/2016    Past Surgical History:  Procedure Laterality Date  . TONSILLECTOMY    . TUBAL LIGATION      OB History    No data available       Home Medications    Prior to Admission medications   Medication Sig Start Date End Date Taking? Authorizing Provider  albuterol (PROVENTIL) (2.5 MG/3ML) 0.083% nebulizer solution Take 3 mLs (2.5 mg total) by nebulization 4 (four) times daily. 04/07/16   Maryann Mikhail, DO  dextromethorphan-guaiFENesin (MUCINEX DM) 30-600 MG 12hr tablet Take 1 tablet by mouth 2 (two) times daily. 04/07/16   Maryann Mikhail, DO  predniSONE (DELTASONE) 20 MG tablet Take 2 tablets (40 mg total) by mouth daily. 01/07/17   Benjiman Core, MD    Family History Family History  Problem Relation Age of Onset  . Lung cancer Mother   . COPD Mother   . Asthma Mother   . Stroke Father   . Hypertension Father   . Diabetes Brother   . Kidney disease Brother     Social  History Social History  Substance Use Topics  . Smoking status: Former Games developer  . Smokeless tobacco: Never Used  . Alcohol use Yes     Comment: occasional     Allergies   Patient has no known allergies.   Review of Systems Review of Systems  Constitutional: Negative for appetite change.  HENT: Negative for congestion.   Respiratory: Positive for cough and shortness of breath.   Cardiovascular: Positive for chest pain.  Gastrointestinal: Negative for abdominal pain and anal bleeding.  Musculoskeletal: Negative for back pain.  Skin: Negative for rash.  Neurological: Negative for tremors.  Hematological: Negative for adenopathy.  Psychiatric/Behavioral: Negative for confusion.     Physical Exam Updated Vital Signs BP 118/69 (BP Location: Left Arm)   Pulse 98   Temp 98.7 F (37.1 C) (Oral)   Resp (!) 24   Ht  (1.651 m)   Wt 207 lb (93.9 kg)   LMP 01/05/2017   SpO2 96%   BMI 34.45 kg/m   Physical Exam  Constitutional: She appears well-developed.  HENT:  Head: Atraumatic.  Eyes: Pupils are equal, round, and reactive to light.  Neck: Neck supple.  Cardiovascular: Normal rate.   Pulmonary/Chest:  Diffuse wheezes and prolonged expirations without localizing rales or rhonchi. No respiratory distress.  Abdominal: Soft.  Neurological: She is alert.  Skin: Skin is  warm. Capillary refill takes less than 2 seconds.     ED Treatments / Results  Labs (all labs ordered are listed, but only abnormal results are displayed) Labs Reviewed - No data to display  EKG  EKG Interpretation None       Radiology No results found.  Procedures Procedures (including critical care time)  Medications Ordered in ED Medications  albuterol (PROVENTIL HFA;VENTOLIN HFA) 108 (90 Base) MCG/ACT inhaler 2 puff (2 puffs Inhalation Given 01/06/17 1611)  albuterol (PROVENTIL) (2.5 MG/3ML) 0.083% nebulizer solution (2.5 mg  Given 01/06/17 1531)  ipratropium-albuterol (DUONEB)  0.5-2.5 (3) MG/3ML nebulizer solution (3 mLs  Given 01/06/17 1531)  albuterol (PROVENTIL) (2.5 MG/3ML) 0.083% nebulizer solution 5 mg (5 mg Nebulization Given 01/06/17 1609)  ipratropium (ATROVENT) nebulizer solution 0.5 mg (0.5 mg Nebulization Given 01/06/17 1609)  predniSONE (DELTASONE) tablet 60 mg (60 mg Oral Given 01/06/17 1610)     Initial Impression / Assessment and Plan / ED Course  I have reviewed the triage vital signs and the nursing notes.  Pertinent labs & imaging results that were available during my care of the patient were reviewed by me and considered in my medical decision making (see chart for details).     Patient was shortness breath cough and wheezing. History of asthma. Wheezing has improved after treatment but still have some wheezing. Steroids given. Not localizing finding on x-ray. Will have follow-up with PCP in 2 days as planned.  Final Clinical Impressions(s) / ED Diagnoses   Final diagnoses:  Exacerbation of asthma, unspecified asthma severity, unspecified whether persistent    New Prescriptions New Prescriptions   PREDNISONE (DELTASONE) 20 MG TABLET    Take 2 tablets (40 mg total) by mouth daily.     Benjiman Core, MD 01/06/17 [phone]

## 2017-01-24 ENCOUNTER — Encounter (HOSPITAL_BASED_OUTPATIENT_CLINIC_OR_DEPARTMENT_OTHER): Payer: Self-pay | Admitting: Emergency Medicine

## 2017-01-24 ENCOUNTER — Emergency Department (HOSPITAL_BASED_OUTPATIENT_CLINIC_OR_DEPARTMENT_OTHER): Payer: 59

## 2017-01-24 ENCOUNTER — Emergency Department (HOSPITAL_BASED_OUTPATIENT_CLINIC_OR_DEPARTMENT_OTHER)
Admission: EM | Admit: 2017-01-24 | Discharge: 2017-01-24 | Disposition: A | Discharge status: Home / Self Care | Payer: 59 | Attending: Emergency Medicine | Admitting: Emergency Medicine

## 2017-01-24 DIAGNOSIS — J45901 Unspecified asthma with (acute) exacerbation: Secondary | ICD-10-CM | POA: Insufficient documentation

## 2017-01-24 DIAGNOSIS — Z87891 Personal history of nicotine dependence: Secondary | ICD-10-CM | POA: Diagnosis not present

## 2017-01-24 DIAGNOSIS — R0602 Shortness of breath: Secondary | ICD-10-CM | POA: Diagnosis present

## 2017-01-24 MED ORDER — PREDNISONE 10 MG PO TABS: ORAL_TABLET | ORAL | 0 refills | Status: DC

## 2017-01-24 MED ORDER — ALBUTEROL SULFATE (2.5 MG/3ML) 0.083% IN NEBU
5.0000 mg | INHALATION_SOLUTION | Freq: Once | RESPIRATORY_TRACT | Status: AC
  Administered 2017-01-24: 5 mg via RESPIRATORY_TRACT
  Filled 2017-01-24: qty 6

## 2017-01-24 MED ORDER — IPRATROPIUM-ALBUTEROL 0.5-2.5 (3) MG/3ML IN SOLN
RESPIRATORY_TRACT | Status: AC
  Administered 2017-01-24: 3 mL
  Filled 2017-01-24: qty 3

## 2017-01-24 MED ORDER — ALBUTEROL SULFATE (2.5 MG/3ML) 0.083% IN NEBU: 2.5000 mg | INHALATION_SOLUTION | Freq: Four times a day (QID) | RESPIRATORY_TRACT | 12 refills | Status: DC | PRN

## 2017-01-24 MED ORDER — PREDNISONE 50 MG PO TABS
60.0000 mg | ORAL_TABLET | Freq: Once | ORAL | Status: AC
  Administered 2017-01-24: 15:00:00 60 mg via ORAL
  Filled 2017-01-24: qty 1

## 2017-01-24 MED ORDER — ALBUTEROL SULFATE (2.5 MG/3ML) 0.083% IN NEBU
INHALATION_SOLUTION | RESPIRATORY_TRACT | Status: AC
  Administered 2017-01-24: 2.5 mg
  Filled 2017-01-24: qty 3

## 2017-01-24 NOTE — ED Notes (Signed)
Pt discharged to home NAD.  

## 2017-01-24 NOTE — ED Triage Notes (Signed)
Pt c/o SHOB/ asthma since last pm - used MDI w/o relief

## 2017-01-24 NOTE — ED Provider Notes (Signed)
[facility]   CSN: [id number] Arrival date & time: 01/24/17  1148     History   Chief Complaint Chief Complaint  Patient presents with  . Shortness of Breath    HPI Carrie Nguyen is a 36 y.o. female.  The history is provided by the patient. No language interpreter was used.  Shortness of Breath  This is a new problem. The problem occurs continuously.The current episode started 2 days ago. The problem has not changed since onset.Associated symptoms include wheezing. The problem's precipitants include pollens. She has tried nothing for the symptoms. The treatment provided moderate relief. She has had no prior hospitalizations. She has had no prior ICU admissions. Associated medical issues include asthma.  Pt reports athma atack last pm.   Pt reports she was unable to sleep due to asthma   Past Medical History:  Diagnosis Date  . Anemia   . Asthma     Patient Active Problem List   Diagnosis Date Noted  . Hypokalemia 04/06/2016  . Sepsis (HCC) 04/06/2016  . Asthma exacerbation 04/06/2016  . Microcytic anemia   . Asthma with status asthmaticus 04/05/2016    Past Surgical History:  Procedure Laterality Date  . TONSILLECTOMY    . TUBAL LIGATION      OB History    No data available       Home Medications    Prior to Admission medications   Medication Sig Start Date End Date Taking? Authorizing Provider  albuterol (PROVENTIL) (2.5 MG/3ML) 0.083% nebulizer solution Take 3 mLs (2.5 mg total) by nebulization every 6 (six) hours as needed for wheezing or shortness of breath. 01/24/17   Elson Areas, PA-C  dextromethorphan-guaiFENesin (MUCINEX DM) 30-600 MG 12hr tablet Take 1 tablet by mouth 2 (two) times daily. 04/07/16   Edsel Petrin, DO  predniSONE (DELTASONE) 10 MG tablet 6,5,4,3,2,1 taper 01/24/17   Elson Areas, PA-C    Family History Family History  Problem Relation Age of Onset  . Lung cancer Mother   . COPD Mother   . Asthma  Mother   . Stroke Father   . Hypertension Father   . Diabetes Brother   . Kidney disease Brother     Social History Social History  Substance Use Topics  . Smoking status: Former Games developer  . Smokeless tobacco: Never Used  . Alcohol use Yes     Comment: occasional     Allergies   Patient has no known allergies.   Review of Systems Review of Systems  Respiratory: Positive for shortness of breath and wheezing.   All other systems reviewed and are negative.    Physical Exam Updated Vital Signs BP 116/73 (BP Location: Right Arm)   Pulse 98   Temp 98.2 F (36.8 C) (Oral)   Resp 18   Ht 5\' 5"  (1.651 m)   Wt 92.5 kg   LMP 01/19/2017   SpO2 98%   BMI 33.95 kg/m   Physical Exam  Constitutional: She appears well-developed and well-nourished. No distress.  HENT:  Head: Normocephalic and atraumatic.  Eyes: Conjunctivae are normal.  Neck: Neck supple.  Cardiovascular: Normal rate and regular rhythm.   No murmur heard. Pulmonary/Chest: Effort normal. No respiratory distress. She has wheezes.  Abdominal: Soft. There is no tenderness.  Musculoskeletal: She exhibits no edema.  Neurological: She is alert.  Skin: Skin is warm and dry.  Psychiatric: She has a normal mood and affect.  Nursing note and vitals reviewed.  ED Treatments / Results  Labs (all labs ordered are listed, but only abnormal results are displayed) Labs Reviewed - No data to display  EKG  EKG Interpretation None       Radiology Dg Chest 2 View  Result Date: 01/24/2017 CLINICAL DATA:  Shortness of breath.  History of asthma. EXAM: CHEST  2 VIEW COMPARISON:  04/05/2016 and 06/17/2015. FINDINGS: The heart size and mediastinal contours are normal. The lungs are clear. There is no pleural effusion or pneumothorax. No acute osseous findings are identified. Stable convex right thoracolumbar scoliosis. IMPRESSION: Stable chest.  No acute cardiopulmonary process. Electronically Signed   By: Carey BullocksWilliam   Veazey M.D.   On: 01/24/2017 14:32    Procedures Procedures (including critical care time)  Medications Ordered in ED Medications  albuterol (PROVENTIL) (2.5 MG/3ML) 0.083% nebulizer solution (2.5 mg  Given 01/24/17 1356)  ipratropium-albuterol (DUONEB) 0.5-2.5 (3) MG/3ML nebulizer solution (3 mLs  Given 01/24/17 1356)  predniSONE (DELTASONE) tablet 60 mg (60 mg Oral Given 01/24/17 1458)  albuterol (PROVENTIL) (2.5 MG/3ML) 0.083% nebulizer solution 5 mg (5 mg Nebulization Given 01/24/17 1455)     Initial Impression / Assessment and Plan / ED Course  I have reviewed the triage vital signs and the nursing notes.  Pertinent labs & imaging results that were available during my care of the patient were reviewed by me and considered in my medical decision making (see chart for details).     Pt given duoneb.  She has continued wheezing.  Pt given 2nd albuterol neb and prednisone.  Pt feels better.  Pt given rx for prednisone and albuterol neb solution.  Pt advised to see her MD for recheck.  Final Clinical Impressions(s) / ED Diagnoses   Final diagnoses:  Moderate asthma with exacerbation, unspecified whether persistent    New Prescriptions Discharge Medication List as of 01/24/2017  2:53 PM    An After Visit Summary was printed and given to the patient. Meds ordered this encounter  Medications  . albuterol (PROVENTIL) (2.5 MG/3ML) 0.083% nebulizer solution    Trinda Pascalick, Nicole   : cabinet override  . ipratropium-albuterol (DUONEB) 0.5-2.5 (3) MG/3ML nebulizer solution    Trinda Pascalick, Nicole   : cabinet override  . predniSONE (DELTASONE) tablet 60 mg  . albuterol (PROVENTIL) (2.5 MG/3ML) 0.083% nebulizer solution 5 mg  . predniSONE (DELTASONE) 10 MG tablet    Sig: 6,5,4,3,2,1 taper    Dispense:  21 tablet    Refill:  0    Order Specific Question:   Supervising Provider    Answer:   MILLER, BRIAN [[id number]]  . albuterol (PROVENTIL) (2.5 MG/3ML) 0.083% nebulizer solution    Sig: Take 3 mLs (2.5 mg  total) by nebulization every 6 (six) hours as needed for wheezing or shortness of breath.    Dispense:  75 mL    Refill:  12    Order Specific Question:   Supervising Provider    Answer:   Eber HongMILLER, BRIAN [[id number]]      Elson AreasSofia, Tekeshia Klahr K, PA-C 01/24/17 1600    Arby BarrettePfeiffer, Marcy, MD 01/24/17 [phone]

## 2017-09-24 ENCOUNTER — Encounter (HOSPITAL_BASED_OUTPATIENT_CLINIC_OR_DEPARTMENT_OTHER): Payer: Self-pay | Admitting: Emergency Medicine

## 2017-09-24 ENCOUNTER — Emergency Department (HOSPITAL_BASED_OUTPATIENT_CLINIC_OR_DEPARTMENT_OTHER)
Admission: EM | Admit: 2017-09-24 | Discharge: 2017-09-24 | Disposition: A | Discharge status: Home / Self Care | Payer: 59 | Attending: Emergency Medicine | Admitting: Emergency Medicine

## 2017-09-24 ENCOUNTER — Other Ambulatory Visit: Payer: Self-pay

## 2017-09-24 DIAGNOSIS — J45909 Unspecified asthma, uncomplicated: Secondary | ICD-10-CM | POA: Insufficient documentation

## 2017-09-24 DIAGNOSIS — Z79899 Other long term (current) drug therapy: Secondary | ICD-10-CM | POA: Insufficient documentation

## 2017-09-24 DIAGNOSIS — Z87891 Personal history of nicotine dependence: Secondary | ICD-10-CM | POA: Insufficient documentation

## 2017-09-24 DIAGNOSIS — K0889 Other specified disorders of teeth and supporting structures: Secondary | ICD-10-CM | POA: Diagnosis present

## 2017-09-24 DIAGNOSIS — K029 Dental caries, unspecified: Secondary | ICD-10-CM | POA: Insufficient documentation

## 2017-09-24 MED ORDER — HYDROCODONE-ACETAMINOPHEN 5-325 MG PO TABS: 1.0000 | ORAL_TABLET | Freq: Four times a day (QID) | ORAL | 0 refills | Status: DC | PRN

## 2017-09-24 MED ORDER — NAPROXEN 250 MG PO TABS
500.0000 mg | ORAL_TABLET | Freq: Once | ORAL | Status: AC
  Administered 2017-09-24: 500 mg via ORAL
  Filled 2017-09-24: qty 2

## 2017-09-24 MED ORDER — PENICILLIN V POTASSIUM 250 MG PO TABS
500.0000 mg | ORAL_TABLET | Freq: Once | ORAL | Status: AC
  Administered 2017-09-24: 500 mg via ORAL
  Filled 2017-09-24: qty 2

## 2017-09-24 MED ORDER — PENICILLIN V POTASSIUM 500 MG PO TABS: 500.0000 mg | ORAL_TABLET | Freq: Four times a day (QID) | ORAL | 0 refills | Status: AC

## 2017-09-24 NOTE — ED Triage Notes (Signed)
Pt c/o dental pain that started last night at 8pm.

## 2017-09-24 NOTE — ED Provider Notes (Signed)
[facility] Provider Note: Carrie Dell, MD, FACEP  CSN: [id number] MRN: [id number] ARRIVAL: 09/24/17 at 0304 ROOM: MH02/MH02   CHIEF COMPLAINT  Dental Pain   HISTORY OF PRESENT ILLNESS  09/24/17 3:35 AM Carrie Nguyen is a 37 y.o. female with a carious right upper second molar.  This tooth began hurting last night about 8 PM.  She states her pain is about an 8 out of 10.  The pain is exacerbated by cold. She has not taken any medications for it.  She denies acute fracture or injury to the tooth.  Consultation with the [facility] state controlled substances database reveals the patient has received 1 prescription for hydrocodone in the past year.   Past Medical History:  Diagnosis Date  . Anemia   . Asthma     Past Surgical History:  Procedure Laterality Date  . TONSILLECTOMY    . TUBAL LIGATION      Family History  Problem Relation Age of Onset  . Lung cancer Mother   . COPD Mother   . Asthma Mother   . Stroke Father   . Hypertension Father   . Diabetes Brother   . Kidney disease Brother     Social History   Tobacco Use  . Smoking status: Former Games developer  . Smokeless tobacco: Never Used  Substance Use Topics  . Alcohol use: Yes    Comment: occasional  . Drug use: No    Prior to Admission medications   Medication Sig Start Date End Date Taking? Authorizing Provider  albuterol (PROVENTIL) (2.5 MG/3ML) 0.083% nebulizer solution Take 3 mLs (2.5 mg total) by nebulization every 6 (six) hours as needed for wheezing or shortness of breath. 01/24/17  Yes Cheron Schaumann K, PA-C  fluticasone furoate-vilanterol (BREO ELLIPTA) 100-25 MCG/INH AEPB Inhale 1 puff into the lungs daily.   Yes [provider]  montelukast (SINGULAIR) 10 MG tablet Take 10 mg by mouth at bedtime.   Yes [provider]  ranitidine (ZANTAC) 150 MG tablet Take 150 mg by mouth 2 (two) times daily.   Yes [provider]    Allergies Patient has no known  allergies.   REVIEW OF SYSTEMS  Negative except as noted here or in the History of Present Illness.   PHYSICAL EXAMINATION  Initial Vital Signs Blood pressure 116/84, pulse 83, temperature 98.2 F (36.8 C), temperature source Oral, resp. rate 16, SpO2 99 %.  Examination General: Well-developed, well-nourished female in no acute distress; appearance consistent with age of record HENT: normocephalic; atraumatic; carious right upper second molar with tenderness to percussion Eyes: pupils equal, round and reactive to light; extraocular muscles intact Neck: supple; no lymphadenopathy Heart: regular rate and rhythm Lungs: clear to auscultation bilaterally Abdomen: soft; nondistended; nontender; bowel sounds present Extremities: No deformity; full range of motion; pulses normal Neurologic: Awake, alert and oriented; motor function intact in all extremities and symmetric; no facial droop Skin: Warm and dry Psychiatric: Normal mood and affect   RESULTS  Summary of this visit's results, reviewed by myself:   EKG Interpretation  Date/Time:    Ventricular Rate:    PR Interval:    QRS Duration:   QT Interval:    QTC Calculation:   R Axis:     Text Interpretation:        Laboratory Studies: No results found for this or any previous visit (from the past 24 hour(s)). Imaging Studies: No results found.  ED COURSE  Nursing notes and initial vitals  signs, including pulse oximetry, reviewed.  Vitals:   09/24/17 0309  BP: 116/84  Pulse: 83  Resp: 16  Temp: 98.2 F (36.8 C)  TempSrc: Oral  SpO2: 99%    PROCEDURES    ED DIAGNOSES     ICD-10-CM   1. Pain due to dental caries K02.9        Jay Kempe, Jonny RuizJohn, MD 09/24/17 [phone]

## 2017-11-10 IMAGING — CR DG CHEST 2V
2 series · 2 of 2 positions shown · non-contrast
Comparison: 04/05/2016 and 06/17/2015.

CLINICAL DATA: Shortness of breath.  History of asthma.

EXAM:
CHEST  2 VIEW

[w chest pa]
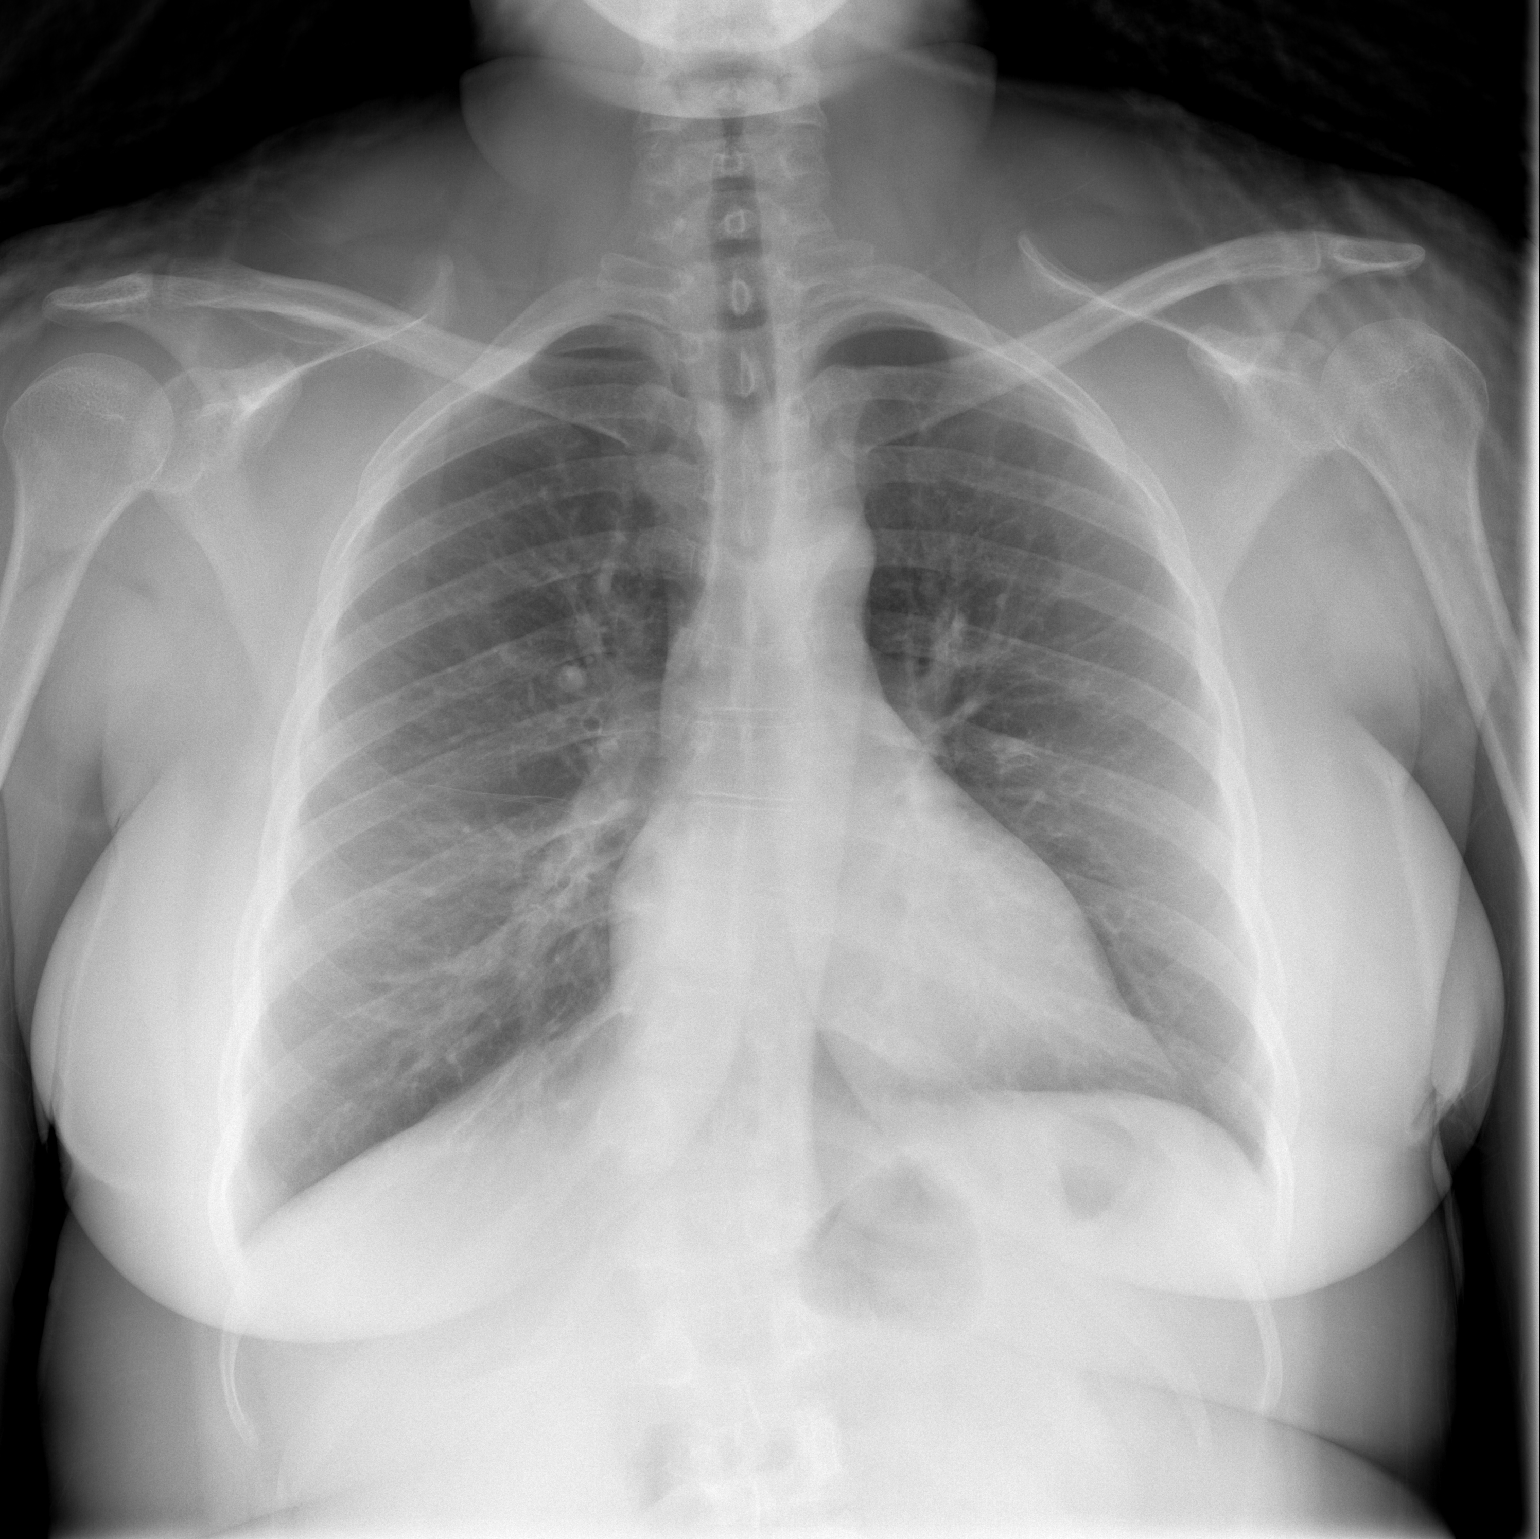

[w chest lat]
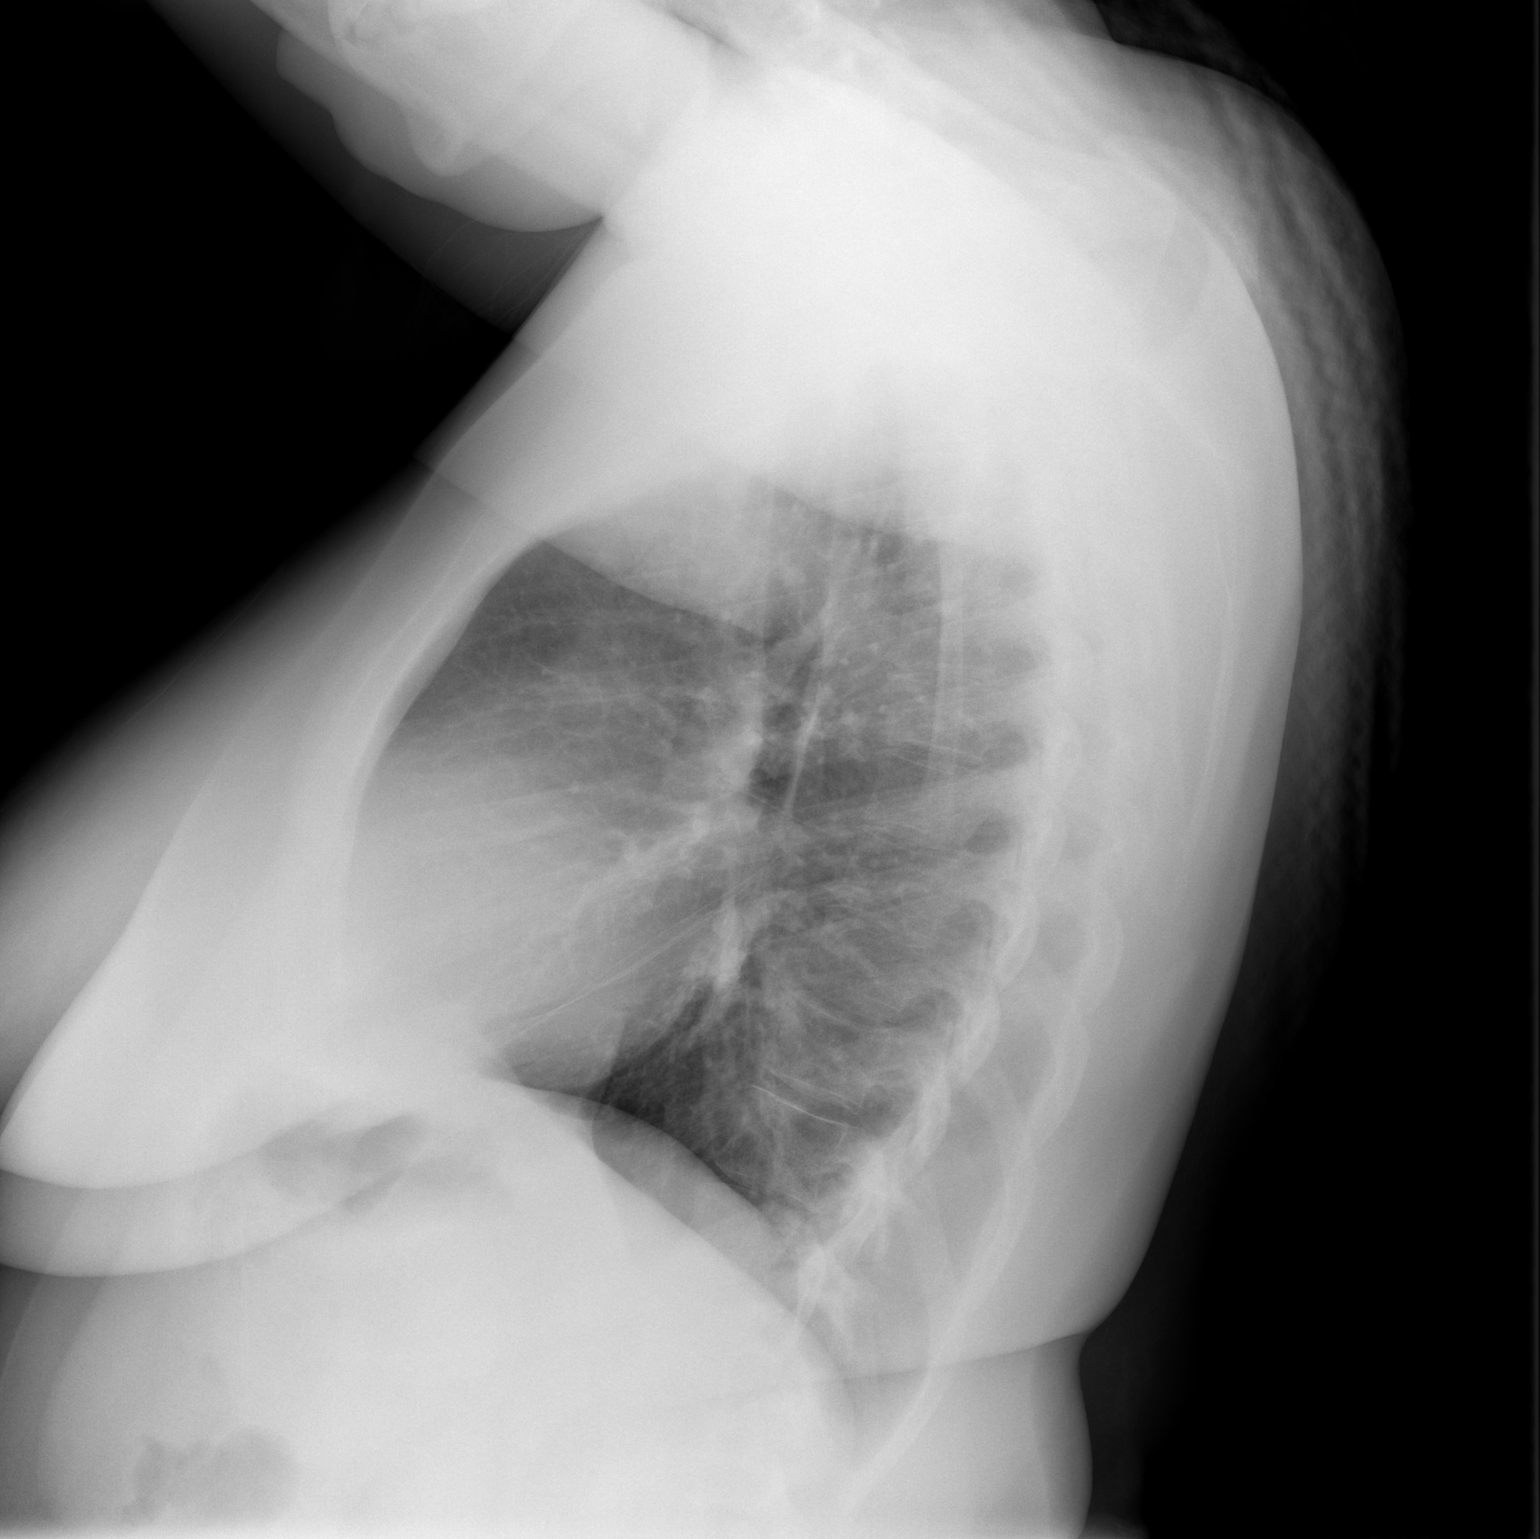

[2 of 2 positions shown; findings below may reference images not displayed]

FINDINGS: The heart size and mediastinal contours are normal. The lungs are
clear. There is no pleural effusion or pneumothorax. No acute
osseous findings are identified. Stable convex right thoracolumbar
scoliosis.
IMPRESSION: Stable chest.  No acute cardiopulmonary process.

## 2017-12-30 ENCOUNTER — Other Ambulatory Visit: Payer: Self-pay

## 2017-12-30 ENCOUNTER — Encounter (HOSPITAL_BASED_OUTPATIENT_CLINIC_OR_DEPARTMENT_OTHER): Payer: Self-pay | Admitting: *Deleted

## 2017-12-30 ENCOUNTER — Emergency Department (HOSPITAL_BASED_OUTPATIENT_CLINIC_OR_DEPARTMENT_OTHER)
Admission: EM | Admit: 2017-12-30 | Discharge: 2017-12-30 | Disposition: A | Discharge status: Home / Self Care | Payer: 59 | Attending: Emergency Medicine | Admitting: Emergency Medicine

## 2017-12-30 ENCOUNTER — Emergency Department (HOSPITAL_BASED_OUTPATIENT_CLINIC_OR_DEPARTMENT_OTHER): Payer: 59

## 2017-12-30 DIAGNOSIS — J45909 Unspecified asthma, uncomplicated: Secondary | ICD-10-CM | POA: Insufficient documentation

## 2017-12-30 DIAGNOSIS — R0789 Other chest pain: Secondary | ICD-10-CM | POA: Diagnosis not present

## 2017-12-30 DIAGNOSIS — Z79899 Other long term (current) drug therapy: Secondary | ICD-10-CM | POA: Diagnosis not present

## 2017-12-30 DIAGNOSIS — R0602 Shortness of breath: Secondary | ICD-10-CM | POA: Diagnosis not present

## 2017-12-30 DIAGNOSIS — Z87891 Personal history of nicotine dependence: Secondary | ICD-10-CM | POA: Diagnosis not present

## 2017-12-30 LAB — COMPREHENSIVE METABOLIC PANEL
ALBUMIN: 4.2 g/dL (ref 3.5–5.0)
ALT: 20 U/L (ref 14–54)
ANION GAP: 11 (ref 5–15)
AST: 24 U/L (ref 15–41)
Alkaline Phosphatase: 71 U/L (ref 38–126)
BUN: 12 mg/dL (ref 6–20)
CO2: 20 mmol/L — ABNORMAL LOW (ref 22–32)
Calcium: 9 mg/dL (ref 8.9–10.3)
Chloride: 103 mmol/L (ref 101–111)
Creatinine, Ser: 0.83 mg/dL (ref 0.44–1.00)
GFR calc Af Amer: >60 mL/min (ref >60)
GFR calc non Af Amer: >60 mL/min (ref >60)
GLUCOSE: 91 mg/dL (ref 65–99)
POTASSIUM: 3.2 mmol/L — AB (ref 3.5–5.1)
SODIUM: 134 mmol/L — AB (ref 135–145)
TOTAL PROTEIN: 8 g/dL (ref 6.5–8.1)
Total Bilirubin: 0.6 mg/dL (ref 0.3–1.2)

## 2017-12-30 LAB — CBC WITH DIFFERENTIAL/PLATELET
BASOS ABS: 0 10*3/uL (ref 0.0–0.1)
Basophils Relative: 0 %
EOS ABS: 0.6 10*3/uL (ref 0.0–0.7)
Eosinophils Relative: 4 %
HCT: 27.1 % — ABNORMAL LOW (ref 36.0–46.0)
Hemoglobin: 8.6 g/dL — ABNORMAL LOW (ref 12.0–15.0)
LYMPHS ABS: 3.2 10*3/uL (ref 0.7–4.0)
Lymphocytes Relative: 21 %
MCH: 20.3 pg — ABNORMAL LOW (ref 26.0–34.0)
MCHC: 31.7 g/dL (ref 30.0–36.0)
MCV: 63.9 fL — ABNORMAL LOW (ref 78.0–100.0)
Monocytes Absolute: 1.2 10*3/uL — ABNORMAL HIGH (ref 0.1–1.0)
Monocytes Relative: 8 %
NEUTROS ABS: 10.4 10*3/uL — AB (ref 1.7–7.7)
Neutrophils Relative %: 67 %
Platelets: 616 10*3/uL — ABNORMAL HIGH (ref 150–400)
RBC: 4.24 MIL/uL (ref 3.87–5.11)
RDW: 19.6 % — AB (ref 11.5–15.5)
WBC: 15.4 10*3/uL — ABNORMAL HIGH (ref 4.0–10.5)

## 2017-12-30 LAB — TROPONIN I: Troponin I: <0.03 ng/mL (ref <0.03)

## 2017-12-30 MED ORDER — PANTOPRAZOLE SODIUM 40 MG PO TBEC
40.0000 mg | DELAYED_RELEASE_TABLET | Freq: Once | ORAL | Status: AC
  Administered 2017-12-30: 40 mg via ORAL
  Filled 2017-12-30: qty 1

## 2017-12-30 MED ORDER — OMEPRAZOLE 20 MG PO CPDR: 20.0000 mg | DELAYED_RELEASE_CAPSULE | Freq: Every day | ORAL | 0 refills | Status: AC

## 2017-12-30 NOTE — ED Notes (Signed)
Please see downtime charting from [phone] 

## 2017-12-30 NOTE — ED Provider Notes (Signed)
[facility] Provider Note: [name], MD, FACEP  CSN: [id number] MRN: [id number] ARRIVAL: 12/30/17 at 0042 ROOM: MH09/MH09   CHIEF COMPLAINT  Chest Pain   HISTORY OF PRESENT ILLNESS  12/30/17 5:31 AM Carrie Nguyen is a 37 y.o. female with a one-month history of chest pain.  The chest pain is episodic.  She describes it as feeling like this sudden spasm in the left chest about the mid axillary line.  It is fairly well localized.  It typically lasts for about 1 minute.  It is moderate to severe when it occurs.  There is nothing that makes it worse or better.  When episodes occur palpation or deep breathing do not affect it.  She has occasionally had some mild shortness of breath but not on a regular basis. She has had no associated nausea, vomiting or diaphoresis.  She is on ranitidine for GERD but states that this has not adequately treated her GERD symptoms.    Past Medical History:  Diagnosis Date  . Anemia   . Asthma     Past Surgical History:  Procedure Laterality Date  . TONSILLECTOMY    . TUBAL LIGATION      Family History  Problem Relation Age of Onset  . Lung cancer Mother   . COPD Mother   . Asthma Mother   . Stroke Father   . Hypertension Father   . Diabetes Brother   . Kidney disease Brother     Social History   Tobacco Use  . Smoking status: Former Games developer  . Smokeless tobacco: Never Used  Substance Use Topics  . Alcohol use: Yes    Comment: occasional  . Drug use: No    Prior to Admission medications   Medication Sig Start Date End Date Taking? Authorizing Provider  albuterol (PROVENTIL) (2.5 MG/3ML) 0.083% nebulizer solution Take 3 mLs (2.5 mg total) by nebulization every 6 (six) hours as needed for wheezing or shortness of breath. 01/24/17   Elson Areas, PA-C  fluticasone furoate-vilanterol (BREO ELLIPTA) 100-25 MCG/INH AEPB Inhale 1 puff into the lungs daily.    [provider]  HYDROcodone-acetaminophen (NORCO) 5-325 MG  tablet Take 1 tablet by mouth every 6 (six) hours as needed (for pain). 09/24/17   Aislinn Feliz, MD  montelukast (SINGULAIR) 10 MG tablet Take 10 mg by mouth at bedtime.    [provider]  ranitidine (ZANTAC) 150 MG tablet Take 150 mg by mouth 2 (two) times daily.    [provider]    Allergies Patient has no known allergies.   REVIEW OF SYSTEMS  Negative except as noted here or in the History of Present Illness.   PHYSICAL EXAMINATION  Initial Vital Signs Blood pressure (!) 119/58, pulse (!) 101, temperature 98.3 F (36.8 C), temperature source Oral, resp. rate 18, height 5\' 5"  (1.651 m), weight 96.2 kg (212 lb), last menstrual period 12/24/2017, SpO2 100 %.  Examination General: Well-developed, well-nourished female in no acute distress; appearance consistent with age of record HENT: normocephalic; atraumatic Eyes: pupils equal, round and reactive to light; extraocular muscles intact Neck: supple Heart: regular rate and rhythm Lungs: clear to auscultation bilaterally Chest: Nontender Abdomen: soft; nondistended; nontender; no masses or hepatosplenomegaly; bowel sounds present Extremities: No deformity; full range of motion; pulses normal Neurologic: Awake, alert and oriented; motor function intact in all extremities and symmetric; no facial droop Skin: Warm and dry Psychiatric: Normal mood and affect   RESULTS  Summary of this visit's results,  reviewed by myself:   EKG Interpretation  Date/Time:  Thursday December 30 2017 00:55:49 EDT Ventricular Rate:  97 PR Interval:  156 QRS Duration: 76 QT Interval:  348 QTC Calculation: 441 R Axis:   54 Text Interpretation:  Normal sinus rhythm with sinus arrhythmia Nonspecific T wave abnormality Abnormal ECG No significant change was found Confirmed by Paula Libra ([id number]) on 12/30/2017 1:00:09 AM      Laboratory Studies: Results for orders placed or performed during the hospital encounter of 12/30/17 (from the  past 24 hour(s))  CBC with Differential/Platelet     Status: Abnormal   Collection Time: 12/30/17  1:00 AM  Result Value Ref Range   WBC 15.4 (H) 4.0 - 10.5 K/uL   RBC 4.24 3.87 - 5.11 MIL/uL   Hemoglobin 8.6 (L) 12.0 - 15.0 g/dL   HCT 19.1 (L) 47.8 - 29.5 %   MCV 63.9 (L) 78.0 - 100.0 fL   MCH 20.3 (L) 26.0 - 34.0 pg   MCHC 31.7 30.0 - 36.0 g/dL   RDW 62.1 (H) 30.8 - 65.7 %   Platelets 616 (H) 150 - 400 K/uL   Neutrophils Relative % 67 %   Lymphocytes Relative 21 %   Monocytes Relative 8 %   Eosinophils Relative 4 %   Basophils Relative 0 %   Neutro Abs 10.4 (H) 1.7 - 7.7 K/uL   Lymphs Abs 3.2 0.7 - 4.0 K/uL   Monocytes Absolute 1.2 (H) 0.1 - 1.0 K/uL   Eosinophils Absolute 0.6 0.0 - 0.7 K/uL   Basophils Absolute 0.0 0.0 - 0.1 K/uL   RBC Morphology POLYCHROMASIA PRESENT   Comprehensive metabolic panel     Status: Abnormal   Collection Time: 12/30/17  1:00 AM  Result Value Ref Range   Sodium 134 (L) 135 - 145 mmol/L   Potassium 3.2 (L) 3.5 - 5.1 mmol/L   Chloride 103 101 - 111 mmol/L   CO2 20 (L) 22 - 32 mmol/L   Glucose, Bld 91 65 - 99 mg/dL   BUN 12 6 - 20 mg/dL   Creatinine, Ser 8.46 0.44 - 1.00 mg/dL   Calcium 9.0 8.9 - 96.2 mg/dL   Total Protein 8.0 6.5 - 8.1 g/dL   Albumin 4.2 3.5 - 5.0 g/dL   AST 24 15 - 41 U/L   ALT 20 14 - 54 U/L   Alkaline Phosphatase 71 38 - 126 U/L   Total Bilirubin 0.6 0.3 - 1.2 mg/dL   GFR calc non Af Amer >60 >60 mL/min   GFR calc Af Amer >60 >60 mL/min   Anion gap 11 5 - 15  Troponin I     Status: None   Collection Time: 12/30/17  1:00 AM  Result Value Ref Range   Troponin I <0.03 <0.03 ng/mL   Imaging Studies: Dg Chest 2 View  Result Date: 12/30/2017 CLINICAL DATA:  Left-sided chest pain. EXAM: CHEST - 2 VIEW COMPARISON:  01/24/2017 FINDINGS: The cardiomediastinal contours are normal. The lungs are clear. Pulmonary vasculature is normal. No consolidation, pleural effusion, or pneumothorax. No acute osseous abnormalities are seen.  IMPRESSION: Negative radiographs of the chest. Electronically Signed   By: Rubye Oaks M.D.   On: 12/30/2017 04:46    ED COURSE  Nursing notes and initial vitals signs, including pulse oximetry, reviewed.  Vitals:   12/30/17 0052  BP: (!) 119/58  Pulse: (!) 101  Resp: 18  Temp: 98.3 F (36.8 C)  TempSrc: Oral  SpO2: 100%  Weight: 96.2  kg (212 lb)  Height: 5\' 5"  (1.651 m)   The patient's chest pain is atypical for cardiac pain.  Because her GERD is not adequately with controlled with an H2 blocker we will switch to a PPI.  We will have her follow-up with her PCP for further evaluation of the chest pain.  PROCEDURES    ED DIAGNOSES     ICD-10-CM   1. Atypical chest pain R07.89        Paula LibraMolpus, Laurajean Hosek, MD 12/30/17 [phone]

## 2017-12-30 NOTE — ED Triage Notes (Signed)
Pt c/o left chest th nausea x 1 day

## 2019-04-27 ENCOUNTER — Other Ambulatory Visit: Payer: Self-pay

## 2019-04-27 ENCOUNTER — Encounter (HOSPITAL_BASED_OUTPATIENT_CLINIC_OR_DEPARTMENT_OTHER): Payer: Self-pay | Admitting: Emergency Medicine

## 2019-04-27 ENCOUNTER — Emergency Department (HOSPITAL_BASED_OUTPATIENT_CLINIC_OR_DEPARTMENT_OTHER)
Admission: EM | Admit: 2019-04-27 | Discharge: 2019-04-27 | Disposition: A | Discharge status: Home / Self Care | Payer: 59 | Attending: Emergency Medicine | Admitting: Emergency Medicine

## 2019-04-27 DIAGNOSIS — Y9389 Activity, other specified: Secondary | ICD-10-CM | POA: Diagnosis not present

## 2019-04-27 DIAGNOSIS — H5712 Ocular pain, left eye: Secondary | ICD-10-CM | POA: Diagnosis present

## 2019-04-27 DIAGNOSIS — Z79899 Other long term (current) drug therapy: Secondary | ICD-10-CM | POA: Diagnosis not present

## 2019-04-27 DIAGNOSIS — J45909 Unspecified asthma, uncomplicated: Secondary | ICD-10-CM | POA: Diagnosis not present

## 2019-04-27 DIAGNOSIS — Y999 Unspecified external cause status: Secondary | ICD-10-CM | POA: Insufficient documentation

## 2019-04-27 DIAGNOSIS — Y9289 Other specified places as the place of occurrence of the external cause: Secondary | ICD-10-CM | POA: Diagnosis not present

## 2019-04-27 DIAGNOSIS — Z87891 Personal history of nicotine dependence: Secondary | ICD-10-CM | POA: Insufficient documentation

## 2019-04-27 DIAGNOSIS — S0502XA Injury of conjunctiva and corneal abrasion without foreign body, left eye, initial encounter: Secondary | ICD-10-CM | POA: Diagnosis not present

## 2019-04-27 DIAGNOSIS — X58XXXA Exposure to other specified factors, initial encounter: Secondary | ICD-10-CM | POA: Insufficient documentation

## 2019-04-27 MED ORDER — CIPROFLOXACIN HCL 0.3 % OP SOLN: 2.0000 [drp] | OPHTHALMIC | 0 refills | Status: DC

## 2019-04-27 MED ORDER — TETRACAINE HCL 0.5 % OP SOLN
2.0000 [drp] | Freq: Once | OPHTHALMIC | Status: AC
  Administered 2019-04-27: 01:00:00 2 [drp] via OPHTHALMIC
  Filled 2019-04-27: qty 4

## 2019-04-27 MED ORDER — FLUORESCEIN SODIUM 1 MG OP STRP
1.0000 | ORAL_STRIP | Freq: Once | OPHTHALMIC | Status: AC
  Administered 2019-04-27: 01:00:00 1 via OPHTHALMIC
  Filled 2019-04-27: qty 1

## 2019-04-27 NOTE — ED Provider Notes (Signed)
[facility] Provider Note   CSN: [id number] Arrival date & time: 04/27/19  0036     History   Chief Complaint Chief Complaint  Patient presents with  . Eye Pain    HPI Carrie Nguyen is a 38 y.o. female.     The history is provided by the patient.  Eye Pain This is a new problem. The current episode started 2 days ago. The problem occurs constantly. The problem has not changed since onset.Pertinent negatives include no chest pain, no abdominal pain, no headaches and no shortness of breath. Nothing aggravates the symptoms. Nothing relieves the symptoms. She has tried nothing for the symptoms. The treatment provided no relief.  Patient is a contact lens wearer and noticed pain and redness 2 days ago.  No HA.  No changes in vision.    Past Medical History:  Diagnosis Date  . Anemia   . Asthma     Patient Active Problem List   Diagnosis Date Noted  . Hypokalemia 04/06/2016  . Sepsis ([place]) 04/06/2016  . Asthma exacerbation 04/06/2016  . Microcytic anemia   . Asthma with status asthmaticus 04/05/2016    Past Surgical History:  Procedure Laterality Date  . TONSILLECTOMY    . TUBAL LIGATION       OB History   No obstetric history on file.      Home Medications    Prior to Admission medications   Medication Sig Start Date End Date Taking? Authorizing Provider  albuterol (PROVENTIL) (2.5 MG/3ML) 0.083% nebulizer solution Take 3 mLs (2.5 mg total) by nebulization every 6 (six) hours as needed for wheezing or shortness of breath. 01/24/17   Fransico Meadow, PA-C  ciprofloxacin (CILOXAN) 0.3 % ophthalmic solution Place 2 drops into the left eye every 4 (four) hours while awake. Administer 1 drop, every 2 hours, while awake, for 2 days. Then 1 drop, every 4 hours, while awake, for the next 5 days. 04/27/19   Holston Oyama, MD  fluticasone furoate-vilanterol (BREO ELLIPTA) 100-25 MCG/INH AEPB Inhale 1 puff into the lungs daily.    [provider]  HYDROcodone-acetaminophen (NORCO) 5-325 MG tablet Take 1 tablet by mouth every 6 (six) hours as needed (for pain). 09/24/17   Molpus, John, MD  montelukast (SINGULAIR) 10 MG tablet Take 10 mg by mouth at bedtime.    [provider]  omeprazole (PRILOSEC) 20 MG capsule Take 1 capsule (20 mg total) by mouth daily. 12/30/17   Molpus, John, MD    Family History Family History  Problem Relation Age of Onset  . Lung cancer Mother   . COPD Mother   . Asthma Mother   . Stroke Father   . Hypertension Father   . Diabetes Brother   . Kidney disease Brother     Social History Social History   Tobacco Use  . Smoking status: Former Research scientist (life sciences)  . Smokeless tobacco: Never Used  Substance Use Topics  . Alcohol use: Yes    Comment: occasional  . Drug use: No     Allergies   Patient has no known allergies.   Review of Systems Review of Systems  Constitutional: Negative for fever.  HENT: Negative for sore throat.   Eyes: Positive for pain and redness. Negative for photophobia, discharge and visual disturbance.  Respiratory: Negative for shortness of breath.   Cardiovascular: Negative for chest pain.  Gastrointestinal: Negative for abdominal pain and vomiting.  Genitourinary: Negative for dysuria.  Musculoskeletal: Negative for neck pain.  Neurological: Negative for facial asymmetry, weakness, light-headedness and headaches.  All other systems reviewed and are negative.    Physical Exam Updated Vital Signs BP 106/82   Pulse 91   Temp 98 F (36.7 C) (Oral)   Resp 16   Ht 5\' 5"  (1.651 m)   Wt 88.5 kg   SpO2 98%   BMI 32.45 kg/m   Physical Exam Vitals signs and nursing note reviewed.  Constitutional:      General: She is not in acute distress. HENT:     Head: Normocephalic and atraumatic.     Nose: Nose normal.     Mouth/Throat:     Mouth: Mucous membranes are moist.     Pharynx: Oropharynx is clear.  Eyes:     General: Lids are normal. Lids are  everted, no foreign bodies appreciated.     Extraocular Movements: Extraocular movements intact.     Pupils: Pupils are equal, round, and reactive to light.     Right eye: Pupil is reactive.     Left eye: Pupil is reactive. Corneal abrasion present.     Funduscopic exam:       Left eye: No papilledema.     Slit lamp exam:    Left eye: No corneal ulcer or photophobia.     Comments: Pressure 15   Neck:     Musculoskeletal: Normal range of motion and neck supple.  Cardiovascular:     Rate and Rhythm: Normal rate and regular rhythm.     Pulses: Normal pulses.     Heart sounds: Normal heart sounds.  Pulmonary:     Effort: Pulmonary effort is normal.     Breath sounds: Normal breath sounds.  Abdominal:     General: Abdomen is flat. Bowel sounds are normal.     Tenderness: There is no abdominal tenderness.  Musculoskeletal: Normal range of motion.  Skin:    General: Skin is warm and dry.     Capillary Refill: Capillary refill takes less than 2 seconds.  Neurological:     General: No focal deficit present.     Mental Status: She is alert and oriented to person, place, and time.  Psychiatric:        Mood and Affect: Mood normal.        Behavior: Behavior normal.      ED Treatments / Results    Visual Acuity  Right Eye Distance: 20/25(Had contact in) Left Eye Distance: 20/25(no corrective lens) Bilateral Distance: 20/20  Right Eye Near:   Left Eye Near:    Bilateral Near:     Radiology No results found.  Procedures Procedures (including critical care time)  Medications Ordered in ED Medications  fluorescein ophthalmic strip 1 strip (1 strip Left Eye Given 04/27/19 0116)  tetracaine (PONTOCAINE) 0.5 % ophthalmic solution 2 drop (2 drops Right Eye Given 04/27/19 0116)     Corneal abrasion likely secondary to contact lens.  Will treat with ciloxan.  No lens wearing.  Wear you back up glasses.  Use all drops.  I do not think this is glaucoma as the patient has normal vision,  pressure in the eye and the pupil is reactive and no clouding of the cornea.  Follow up with ophthalmology.   Final Clinical Impressions(s) / ED Diagnoses   Final diagnoses:  Abrasion of left cornea, initial encounter   Return for intractable cough, coughing up blood,fevers >100.4 unrelieved by medication, shortness of breath, intractable vomiting, chest pain, shortness of breath, weakness,numbness, changes  in speech, facial asymmetry,abdominal pain, passing out,Inability to tolerate liquids or food, cough, altered mental status or any concerns. No signs of systemic illness or infection. The patient is nontoxic-appearing on exam and vital signs are within normal limits.   I have reviewed the triage vital signs and the nursing notes. Pertinent labs &imaging results that were available during my care of the patient were reviewed by me and considered in my medical decision making (see chart for details).  After history, exam, and medical workup I feel the patient has been appropriately medically screened and is safe for discharge home. Pertinent diagnoses were discussed with the patient. Patient was given return precautions    ED Discharge Orders         Ordered    ciprofloxacin (CILOXAN) 0.3 % ophthalmic solution  Every 4 hours while awake     04/27/19 0110           Elky Funches, MD 04/27/19 [id number]

## 2019-04-27 NOTE — ED Notes (Signed)
Pt and family member understood dc material. NAD noted. Script given at [employer]. Pt and family member escorted to check out window. All questions answered to satisfaction

## 2019-04-27 NOTE — ED Triage Notes (Signed)
Pt c/o left eye pain with light sensitivity since yesterday. Pt also reports runny nose and sneezing. Denies any fever.

## 2019-08-15 ENCOUNTER — Other Ambulatory Visit: Payer: Self-pay

## 2019-08-15 ENCOUNTER — Encounter (HOSPITAL_BASED_OUTPATIENT_CLINIC_OR_DEPARTMENT_OTHER): Payer: Self-pay

## 2019-08-15 ENCOUNTER — Emergency Department (HOSPITAL_BASED_OUTPATIENT_CLINIC_OR_DEPARTMENT_OTHER)
Admission: EM | Admit: 2019-08-15 | Discharge: 2019-08-15 | Disposition: A | Discharge status: Home / Self Care | Payer: 59 | Attending: Emergency Medicine | Admitting: Emergency Medicine

## 2019-08-15 ENCOUNTER — Emergency Department (HOSPITAL_BASED_OUTPATIENT_CLINIC_OR_DEPARTMENT_OTHER): Payer: 59

## 2019-08-15 DIAGNOSIS — D649 Anemia, unspecified: Secondary | ICD-10-CM | POA: Diagnosis not present

## 2019-08-15 DIAGNOSIS — J45909 Unspecified asthma, uncomplicated: Secondary | ICD-10-CM | POA: Diagnosis not present

## 2019-08-15 DIAGNOSIS — Z87891 Personal history of nicotine dependence: Secondary | ICD-10-CM | POA: Insufficient documentation

## 2019-08-15 DIAGNOSIS — E876 Hypokalemia: Secondary | ICD-10-CM

## 2019-08-15 DIAGNOSIS — R1032 Left lower quadrant pain: Secondary | ICD-10-CM | POA: Diagnosis present

## 2019-08-15 DIAGNOSIS — Z79899 Other long term (current) drug therapy: Secondary | ICD-10-CM | POA: Insufficient documentation

## 2019-08-15 DIAGNOSIS — N760 Acute vaginitis: Secondary | ICD-10-CM | POA: Diagnosis not present

## 2019-08-15 DIAGNOSIS — Z711 Person with feared health complaint in whom no diagnosis is made: Secondary | ICD-10-CM

## 2019-08-15 DIAGNOSIS — B9689 Other specified bacterial agents as the cause of diseases classified elsewhere: Secondary | ICD-10-CM

## 2019-08-15 DIAGNOSIS — N739 Female pelvic inflammatory disease, unspecified: Secondary | ICD-10-CM | POA: Diagnosis not present

## 2019-08-15 LAB — CBC
HCT: 25.9 % — ABNORMAL LOW (ref 36.0–46.0)
Hemoglobin: 7.4 g/dL — ABNORMAL LOW (ref 12.0–15.0)
MCH: 17.8 pg — ABNORMAL LOW (ref 26.0–34.0)
MCHC: 28.6 g/dL — ABNORMAL LOW (ref 30.0–36.0)
MCV: 62.3 fL — ABNORMAL LOW (ref 80.0–100.0)
Platelets: 828 10*3/uL — ABNORMAL HIGH (ref 150–400)
RBC: 4.16 MIL/uL (ref 3.87–5.11)
RDW: 24.5 % — ABNORMAL HIGH (ref 11.5–15.5)
WBC: 10.3 10*3/uL (ref 4.0–10.5)
nRBC: 0 % (ref 0.0–0.2)

## 2019-08-15 LAB — LIPASE, BLOOD: Lipase: 23 U/L (ref 11–51)

## 2019-08-15 LAB — COMPREHENSIVE METABOLIC PANEL
ALT: 8 U/L (ref 0–44)
AST: 15 U/L (ref 15–41)
Albumin: 4.1 g/dL (ref 3.5–5.0)
Alkaline Phosphatase: 52 U/L (ref 38–126)
Anion gap: 9 (ref 5–15)
BUN: 9 mg/dL (ref 6–20)
CO2: 23 mmol/L (ref 22–32)
Calcium: 9.5 mg/dL (ref 8.9–10.3)
Chloride: 103 mmol/L (ref 98–111)
Creatinine, Ser: 0.79 mg/dL (ref 0.44–1.00)
GFR calc Af Amer: >60 mL/min (ref >60)
GFR calc non Af Amer: >60 mL/min (ref >60)
Glucose, Bld: 104 mg/dL — ABNORMAL HIGH (ref 70–99)
Potassium: 2.7 mmol/L — CL (ref 3.5–5.1)
Sodium: 135 mmol/L (ref 135–145)
Total Bilirubin: 0.8 mg/dL (ref 0.3–1.2)
Total Protein: 7.9 g/dL (ref 6.5–8.1)

## 2019-08-15 LAB — WET PREP, GENITAL
Sperm: NONE SEEN
Trich, Wet Prep: NONE SEEN
Yeast Wet Prep HPF POC: NONE SEEN

## 2019-08-15 LAB — URINALYSIS, ROUTINE W REFLEX MICROSCOPIC
Bilirubin Urine: NEGATIVE
Glucose, UA: NEGATIVE mg/dL
Ketones, ur: NEGATIVE mg/dL
Leukocytes,Ua: NEGATIVE
Nitrite: NEGATIVE
Protein, ur: NEGATIVE mg/dL
Specific Gravity, Urine: 1.025 (ref 1.005–1.030)
pH: 6 (ref 5.0–8.0)

## 2019-08-15 LAB — URINALYSIS, MICROSCOPIC (REFLEX)

## 2019-08-15 LAB — PREGNANCY, URINE: Preg Test, Ur: NEGATIVE

## 2019-08-15 MED ORDER — SODIUM CHLORIDE 0.9% FLUSH
3.0000 mL | Freq: Once | INTRAVENOUS | Status: AC
  Administered 2019-08-15: 3 mL via INTRAVENOUS
  Filled 2019-08-15: qty 3

## 2019-08-15 MED ORDER — DOXYCYCLINE HYCLATE 100 MG PO TABS
100.0000 mg | ORAL_TABLET | Freq: Once | ORAL | Status: AC
  Administered 2019-08-15: 100 mg via ORAL
  Filled 2019-08-15: qty 1

## 2019-08-15 MED ORDER — POTASSIUM CHLORIDE ER 10 MEQ PO TBCR: 30.0000 meq | EXTENDED_RELEASE_TABLET | Freq: Every day | ORAL | 0 refills | Status: DC

## 2019-08-15 MED ORDER — DOXYCYCLINE HYCLATE 100 MG PO CAPS: 100.0000 mg | ORAL_CAPSULE | Freq: Two times a day (BID) | ORAL | 0 refills | Status: AC

## 2019-08-15 MED ORDER — SODIUM CHLORIDE 0.9 % IV BOLUS
500.0000 mL | Freq: Once | INTRAVENOUS | Status: AC
  Administered 2019-08-15: 500 mL via INTRAVENOUS

## 2019-08-15 MED ORDER — FERROUS SULFATE 325 (65 FE) MG PO TABS: 325.0000 mg | ORAL_TABLET | Freq: Every day | ORAL | 0 refills | Status: AC

## 2019-08-15 MED ORDER — POTASSIUM CHLORIDE 10 MEQ/100ML IV SOLN
10.0000 meq | Freq: Once | INTRAVENOUS | Status: AC
  Administered 2019-08-15: 10 meq via INTRAVENOUS
  Filled 2019-08-15: qty 100

## 2019-08-15 MED ORDER — METRONIDAZOLE 500 MG PO TABS: 500.0000 mg | ORAL_TABLET | Freq: Two times a day (BID) | ORAL | 0 refills | Status: AC

## 2019-08-15 MED ORDER — SODIUM CHLORIDE 0.9 % IV BOLUS
1000.0000 mL | Freq: Once | INTRAVENOUS | Status: AC
  Administered 2019-08-15: 1000 mL via INTRAVENOUS

## 2019-08-15 MED ORDER — CEFTRIAXONE SODIUM 250 MG IJ SOLR
250.0000 mg | Freq: Once | INTRAMUSCULAR | Status: AC
  Administered 2019-08-15: 250 mg via INTRAMUSCULAR
  Filled 2019-08-15: qty 250

## 2019-08-15 MED ORDER — IOHEXOL 300 MG/ML  SOLN
100.0000 mL | Freq: Once | INTRAMUSCULAR | Status: AC | PRN
  Administered 2019-08-15: 21:00:00 100 mL via INTRAVENOUS

## 2019-08-15 MED ORDER — LIDOCAINE HCL 1 % IJ SOLN
INTRAMUSCULAR | Status: AC
  Administered 2019-08-15: 2 mL
  Filled 2019-08-15: qty 20

## 2019-08-15 MED ORDER — METRONIDAZOLE 500 MG PO TABS
500.0000 mg | ORAL_TABLET | Freq: Once | ORAL | Status: AC
  Administered 2019-08-15: 22:00:00 500 mg via ORAL
  Filled 2019-08-15: qty 1

## 2019-08-15 NOTE — Discharge Instructions (Addendum)
You have been diagnosed today with Pelvic inflammatory disease, hypokalemia, anemia, concern for STI, bacterial vaginosis.  At this time there does not appear to be the presence of an emergent medical condition, however there is always the potential for conditions to change. Please read and follow the below instructions.  Please return to the Emergency Department immediately for any new or worsening symptoms. Please be sure to follow up with your Primary Care Provider within one week regarding your visit today; please call their office to schedule an appointment even if you are feeling better for a follow-up visit. Please take the medications doxycycline and Flagyl as prescribed until completed for treatment of your pelvic inflammatory disease and bacterial vaginosis.  Do not drink alcohol with Flagyl as it will make you very sick.  Please be sure to drink plenty of water and get plenty of rest.  Do not have sexual intercourse until you have finished all of your treatments and have received all of your test results and are cleared by your OB/GYN and PCP to return to sexual activity. You have been treated presumptively today for gonorrhea and chlamydia. You have been tested today for gonorrhea and chlamydia as well as HIV and syphilis. These results will be available in approximately 3 days. You may check your MyChart account for results. Please inform all sexual partners of positive results and that they should be tested and treated as well. Please wait 2 weeks and be sure that you and your partners are symptom free before returning to sexual activity. Please use protection with every sexual encounter. Follow Up: Please followup with your primary doctor in 3 days for discussion of your diagnoses and further evaluation after today's visit; if you do not have a primary care doctor use the resource guide provided to find one; Please return to the ER for worsening symptoms, high fevers or persistent  vomiting. Additionally you were hemoglobin count was low today at 7.4.  Please take the iron supplements as prescribed to help raise your hemoglobin level.  Please discuss your low hemoglobin level with your primary care provider at your visit this week and schedule a recheck of your blood work.  Return to the ER immediately if you develop any signs or symptoms of anemia. Your potassium level was very low today as well, please discuss this with your primary care provider at your next visit.  Please take the potassium supplements as prescribed to improve your potassium level.  Please have your potassium level rechecked at your primary care provider's office this week. Please call the OB/GYN Dr. Glenna Durand office on your discharge paperwork to schedule follow-up appointment for within 1 week for recheck.  Get help right away if you: Have chest pain. Have shortness of breath. Have vomiting or diarrhea that lasts for more than 2 days. Faint. You are very weak. You are short of breath. You have pain in your abdomen or chest. You are dizzy or feel faint. You have trouble concentrating. You have bloody or black, tarry stools. You vomit repeatedly or you vomit up blood. You have more pain in the belly area. You have chills. You are not better in 72 hours with treatment. You have any new/concerning or worsening of symptoms  Please read the additional information packets attached to your discharge summary.  Do not take your medicine if  develop an itchy rash, swelling in your mouth or lips, or difficulty breathing; call 911 and seek immediate emergency medical attention if this occurs.  Note:  Portions of this text may have been transcribed using voice recognition software. Every effort was made to ensure accuracy; however, inadvertent computerized transcription errors may still be present.

## 2019-08-15 NOTE — ED Triage Notes (Signed)
Pt c/o LLQ pain, freq urination x 5 days-NAD-steady gait

## 2019-08-15 NOTE — ED Notes (Signed)
Pt arrived in room, placed on monitor and EKG obtained.  Initial labwork obtained in triage.  Noted Postassium 2.7.  Pt denies chest pain. No active vomiting at this time.

## 2019-08-15 NOTE — ED Provider Notes (Signed)
[facility] EMERGENCY DEPARTMENT Provider Note   CSN: [id number] Arrival date & time: 08/15/19  1553     History   Chief Complaint Chief Complaint  Patient presents with  . Abdominal Pain    HPI Carrie Nguyen is a 38 y.o. female   Patient presents today with left lower quadrant pain x1 week.  Pain described as a constant throbbing worsened with palpation no alleviating factors, no radiation and moderate in intensity.  Patient denies history of similar in the past.  Additionally reports urinary frequency x1-2 days.  Patient denies concern for STI today, she reports she just finished her menstrual cycle and has not noticed any abnormal discharge or bleeding.  She reports she is only sexually active with one partner without protection.  Denies fever/chills, headache, neck pain, chest pain/shortness of breath, upper abdominal pain, nausea/vomiting, diarrhea, blood in the stool, melena, dysuria/hematuria, vaginal discharge or any additional concerns.     HPI  Past Medical History:  Diagnosis Date  . Anemia   . Asthma     Patient Active Problem List   Diagnosis Date Noted  . Hypokalemia 04/06/2016  . Sepsis (HCC) 04/06/2016  . Asthma exacerbation 04/06/2016  . Microcytic anemia   . Asthma with status asthmaticus 04/05/2016    Past Surgical History:  Procedure Laterality Date  . TONSILLECTOMY    . TUBAL LIGATION       OB History   No obstetric history on file.      Home Medications    Prior to Admission medications   Medication Sig Start Date End Date Taking? Authorizing Provider  albuterol (PROVENTIL) (2.5 MG/3ML) 0.083% nebulizer solution Take 3 mLs (2.5 mg total) by nebulization every 6 (six) hours as needed for wheezing or shortness of breath. 01/24/17   Elson Areas, PA-C  ciprofloxacin (CILOXAN) 0.3 % ophthalmic solution Place 2 drops into the left eye every 4 (four) hours while awake. Administer 1 drop, every 2 hours, while awake, for 2 days. Then  1 drop, every 4 hours, while awake, for the next 5 days. 04/27/19   Palumbo, April, MD  doxycycline (VIBRAMYCIN) 100 MG capsule Take 1 capsule (100 mg total) by mouth 2 (two) times daily for 14 days. 08/15/19 08/29/19  Harlene Salts A, PA-C  ferrous sulfate 325 (65 FE) MG tablet Take 1 tablet (325 mg total) by mouth daily. 08/15/19   Harlene Salts A, PA-C  fluticasone furoate-vilanterol (BREO ELLIPTA) 100-25 MCG/INH AEPB Inhale 1 puff into the lungs daily.    [provider]  HYDROcodone-acetaminophen (NORCO) 5-325 MG tablet Take 1 tablet by mouth every 6 (six) hours as needed (for pain). 09/24/17   Molpus, John, MD  metroNIDAZOLE (FLAGYL) 500 MG tablet Take 1 tablet (500 mg total) by mouth 2 (two) times daily for 7 days. 08/15/19 08/22/19  Harlene Salts A, PA-C  montelukast (SINGULAIR) 10 MG tablet Take 10 mg by mouth at bedtime.    [provider]  omeprazole (PRILOSEC) 20 MG capsule Take 1 capsule (20 mg total) by mouth daily. 12/30/17   Molpus, John, MD  potassium chloride (KLOR-CON) 10 MEQ tablet Take 3 tablets (30 mEq total) by mouth daily for 4 days. 08/15/19 08/19/19  Bill Salinas, PA-C    Family History Family History  Problem Relation Age of Onset  . Lung cancer Mother   . COPD Mother   . Asthma Mother   . Stroke Father   . Hypertension Father   . Diabetes Brother   .  Kidney disease Brother     Social History Social History   Tobacco Use  . Smoking status: Former Games developer  . Smokeless tobacco: Never Used  Substance Use Topics  . Alcohol use: Yes    Comment: occasional  . Drug use: No     Allergies   Patient has no known allergies.   Review of Systems Review of Systems Ten systems are reviewed and are negative for acute change except as noted in the HPI   Physical Exam Updated Vital Signs BP 114/83   Pulse 100   Temp 98.7 F (37.1 C) (Oral)   Resp (!) 23   Ht 5\' 5"  (1.651 m)   Wt 78.9 kg   LMP 08/06/2019   SpO2 100%   BMI 28.96  kg/m   Physical Exam Constitutional:      General: She is not in acute distress.    Appearance: Normal appearance. She is well-developed. She is not ill-appearing or diaphoretic.  HENT:     Head: Normocephalic and atraumatic.     Right Ear: External ear normal.     Left Ear: External ear normal.     Nose: Nose normal.  Eyes:     General: Vision grossly intact. Gaze aligned appropriately.     Pupils: Pupils are equal, round, and reactive to light.  Neck:     Musculoskeletal: Normal range of motion.     Trachea: Trachea and phonation normal. No tracheal deviation.  Cardiovascular:     Rate and Rhythm: Normal rate and regular rhythm.     Heart sounds: Normal heart sounds.  Pulmonary:     Effort: Pulmonary effort is normal. No respiratory distress.  Abdominal:     General: There is no distension.     Palpations: Abdomen is soft.     Tenderness: There is abdominal tenderness in the suprapubic area and left lower quadrant. There is no guarding or rebound.  Genitourinary:    Comments: Exam chaperoned by 08/08/2019.  Pelvic exam: normal external genitalia without evidence of trauma. VULVA: normal appearing vulva with no masses, tenderness or lesion. VAGINA: normal appearing vagina with normal color, no lesions CERVIX: normal appearing cervix without lesions, CMT +, copious purulent discharge present from os and vaginal canal. Wet prep and DNA probe for chlamydia and GC obtained.   ADNEXA: Mild bilateral adnexal tenderness. UTERUS: uterus is normal size, shape, consistency. Musculoskeletal: Normal range of motion.  Skin:    General: Skin is warm and dry.  Neurological:     Mental Status: She is alert.     GCS: GCS eye subscore is 4. GCS verbal subscore is 5. GCS motor subscore is 6.     Comments: Speech is clear and goal oriented, follows commands Major Cranial nerves without deficit, no facial droop Moves extremities without ataxia, coordination intact  Psychiatric:         Behavior: Behavior normal.    ED Treatments / Results  Labs (all labs ordered are listed, but only abnormal results are displayed) Labs Reviewed  WET PREP, GENITAL - Abnormal; Notable for the following components:      Result Value   Clue Cells Wet Prep HPF POC PRESENT (*)    WBC, Wet Prep HPF POC MANY (*)    All other components within normal limits  COMPREHENSIVE METABOLIC PANEL - Abnormal; Notable for the following components:   Potassium 2.7 (*)    Glucose, Bld 104 (*)    All other components within normal limits  CBC -  Abnormal; Notable for the following components:   Hemoglobin 7.4 (*)    HCT 25.9 (*)    MCV 62.3 (*)    MCH 17.8 (*)    MCHC 28.6 (*)    RDW 24.5 (*)    Platelets 828 (*)    All other components within normal limits  URINALYSIS, ROUTINE W REFLEX MICROSCOPIC - Abnormal; Notable for the following components:   Hgb urine dipstick TRACE (*)    All other components within normal limits  URINALYSIS, MICROSCOPIC (REFLEX) - Abnormal; Notable for the following components:   Bacteria, UA FEW (*)    All other components within normal limits  LIPASE, BLOOD  PREGNANCY, URINE  RPR  HIV ANTIBODY (ROUTINE TESTING W REFLEX)  GC/CHLAMYDIA PROBE AMP ([place]) NOT AT [facility]    EKG EKG Interpretation  Date/Time:  Tuesday August 15 2019 19:07:46 EST Ventricular Rate:  96 PR Interval:    QRS Duration: 83 QT Interval:  345 QTC Calculation: 436 R Axis:   68 Text Interpretation: Sinus rhythm Borderline T abnormalities, anterior leads When compared to prior, no significant cahnges seen., No STEMI Confirmed by Theda Belfast ([id number]) on 08/15/2019 8:00:37 PM   Radiology Ct Abdomen Pelvis W Contrast  Result Date: 08/15/2019 CLINICAL DATA:  Acute abdominal pain with diverticulitis suspected. Left lower quadrant pain. EXAM: CT ABDOMEN AND PELVIS WITH CONTRAST TECHNIQUE: Multidetector CT imaging of the abdomen and pelvis was performed using the standard protocol  following bolus administration of intravenous contrast. CONTRAST:  OMNIPAQUE IOHEXOL 300 MG/ML  SOLN COMPARISON:  None. FINDINGS: Lower chest: The lung bases are clear. The heart size is normal. Hepatobiliary: The liver is normal. Normal gallbladder.There is no biliary ductal dilation. Pancreas: Normal contours without ductal dilatation. No peripancreatic fluid collection. Spleen: No splenic laceration or hematoma. Adrenals/Urinary Tract: --Adrenal glands: No adrenal hemorrhage. --Right kidney/ureter: No hydronephrosis or perinephric hematoma. --Left kidney/ureter: No hydronephrosis or perinephric hematoma. --Urinary bladder: Unremarkable. Stomach/Bowel: --Stomach/Duodenum: No hiatal hernia or other gastric abnormality. Normal duodenal course and caliber. --Small bowel: No dilatation or inflammation. --Colon: No focal abnormality. --Appendix: Normal. Vascular/Lymphatic: There is no aortic aneurysm. No significant atherosclerotic changes. There are prominent pelvic veins bilaterally. --No retroperitoneal lymphadenopathy. --No mesenteric lymphadenopathy. --there are slightly prominent pelvic sidewall lymph nodes measuring up to approximately 0.9 cm on the left. Reproductive: There is free fluid in the patient's pelvis. There appear to be slightly dilated tubular structures bilaterally in the region of the adnexa. The ovaries are unremarkable with respect to size. Other: There is a small amount of free fluid in the patient's pelvis. There is no free air. The abdominal wall is normal. Musculoskeletal. No acute displaced fractures. IMPRESSION: 1. There is free fluid in the patient's pelvis. There appear to be slightly dilated tubular structures bilaterally in the region of the adnexa. These findings could be seen with pelvic inflammatory disease. 2. There are prominent pelvic veins bilaterally. This can be seen in the setting of pelvic congestion syndrome. Electronically Signed   By: Katherine Mantle M.D.   On:  08/15/2019 20:49    Procedures Procedures (including critical care time)  Medications Ordered in ED Medications  sodium chloride flush (NS) 0.9 % injection 3 mL (3 mLs Intravenous Given 08/15/19 2113)  potassium chloride 10 mEq in 100 mL IVPB ( Intravenous Stopped 08/15/19 2119)  sodium chloride 0.9 % bolus 500 mL ( Intravenous Stopped 08/15/19 2106)  iohexol (OMNIPAQUE) 300 MG/ML solution 100 mL (100 mLs Intravenous Contrast Given 08/15/19 2034)  sodium  chloride 0.9 % bolus 1,000 mL (1,000 mLs Intravenous New Bag/Given 08/15/19 2210)  cefTRIAXone (ROCEPHIN) injection 250 mg (250 mg Intramuscular Given 08/15/19 2228)  doxycycline (VIBRA-TABS) tablet 100 mg (100 mg Oral Given 08/15/19 2226)  metroNIDAZOLE (FLAGYL) tablet 500 mg (500 mg Oral Given 08/15/19 2226)  lidocaine (XYLOCAINE) 1 % (with pres) injection (2 mLs  Given 08/15/19 2228)     Initial Impression / Assessment and Plan / ED Course  I have reviewed the triage vital signs and the nursing notes.  Pertinent labs & imaging results that were available during my care of the patient were reviewed by me and considered in my medical decision making (see chart for details).  Clinical Course as of Aug 14 2230  Tue Aug 15, 2019  2145 Dr. Nehemiah Settle; IM rocephin doxy 100mg  2 week.   [BM]    Clinical Course User Index [BM] Deliah Boston, PA-C   CBC with hemoglobin 7.4, microcytic anemia CMP potassium 2.7 Lipase within normal limits Urinalysis with hemoglobin, few bacteria, 6-10 squamous cells Pregnancy test negative - On initial evaluation patient is well-appearing in no acute distress left lower quadrant tenderness to palpation.  She is asked to defer pelvic examination until after CT scan, concern for possible diverticulitis or stone.  She denies concern for STI today, pain ongoing for 1 week lower suspicion for torsion.  Additionally will replete patient's potassium starting here in the ER.  She is anemic to 7.4 she is  asymptomatic regarding her anemia today, her hemoglobin is 1.2 lower than a year ago, she reports that she just finished her menstrual cycle yesterday. - CT abdomen pelvis:  IMPRESSION:  1. There is free fluid in the patient's pelvis. There appear to be  slightly dilated tubular structures bilaterally in the region of the  adnexa. These findings could be seen with pelvic inflammatory  disease.  2. There are prominent pelvic veins bilaterally. This can be seen in  the setting of pelvic congestion syndrome.  - Patient updated on CT scan findings, she is now agreeable to pelvic examination.  Examination reveals copious purulent cervical discharge that accumulates in the vaginal vault.  She has diffuse pelvic tenderness to palpation.  There is no ultrasound available at our facility at this time.  Discussed case with Dr. Sherry Ruffing, concern for possible TOA will discuss with obstetrics for their input. - Discussed case with Dr. Nehemiah Settle from obstetrics service.  He advises that a TOA would be visible on CT scan.  He advises that if patient tolerates p.o. we may give her dose of IV Rocephin followed by 2-week course of doxycycline 100 mg twice daily and patient is to follow-up as outpatient in their office.  No indication for admission or transfer for ultrasound at this time.  Advises discharge after medications. - Patient advised of Dr. Glenna Durand plan and she is agreeable.  She has been given 250 mg IM Rocephin here in the emergency department started on her doxycycline 100 mg twice daily x2 weeks.  Additionally clue cells present will treat patient for bacterial vaginosis with Flagyl 500 mg twice daily x7 days.  She has been given referral to OB/GYN and encouraged to call their office tomorrow morning to schedule a follow-up appointment.  Patient advised that if her symptoms worsen in any way to return immediately to the emergency department.  She is also been advised of her low hemoglobin and potassium  levels today, she is advised to follow-up for recheck with your primary care provider within  1 week and to return to the emergency department if she becomes symptomatic in any way, she will be discharged with p.o. potassium supplements and iron supplements.  Finally patient advised to tell all sexual partners to be tested and treated for STI.  Patient is aware that HIV, syphilis, gonorrhea and chlamydia tests are pending and will result in the next few days she is to check her MyChart account for results.  Patient advised to remain abstinent until symptom-free x2 weeks and to always use protection with sexual activity.  At this time there does not appear to be any evidence of an acute emergency medical condition and the patient appears stable for discharge with appropriate outpatient follow up. Diagnosis was discussed with patient who verbalizes understanding of care plan and is agreeable to discharge. I have discussed return precautions with patient who verbalizes understanding of return precautions. Patient encouraged to follow-up with their PCP and OBGYN. All questions answered.  Patient's case discussed with Dr. Rush Landmarkegeler who agrees with plan to discharge with doxycycline, Flagyl, iron, potassium and OB/GYN follow-up.   Note: Portions of this report may have been transcribed using voice recognition software. Every effort was made to ensure accuracy; however, inadvertent computerized transcription errors may still be present. Final Clinical Impressions(s) / ED Diagnoses   Final diagnoses:  Pelvic inflammatory disease  Concern about STD in female without diagnosis  Bacterial vaginosis  Anemia, unspecified type  Hypokalemia    ED Discharge Orders         Ordered    metroNIDAZOLE (FLAGYL) 500 MG tablet  2 times daily     08/15/19 2231    doxycycline (VIBRAMYCIN) 100 MG capsule  2 times daily     08/15/19 2231    potassium chloride (KLOR-CON) 10 MEQ tablet  Daily     08/15/19 2231    ferrous  sulfate 325 (65 FE) MG tablet  Daily     08/15/19 2231           Elizabeth PalauMorelli, Rigel Filsinger A, PA-C 08/15/19 2232    Tegeler, Canary Brimhristopher J, MD 08/16/19 [phone]

## 2019-08-15 NOTE — ED Notes (Signed)
Patient transported to CT 

## 2019-08-16 LAB — HIV ANTIBODY (ROUTINE TESTING W REFLEX): HIV Screen 4th Generation wRfx: NONREACTIVE

## 2019-08-16 LAB — RPR: RPR Ser Ql: NONREACTIVE

## 2019-08-18 LAB — GC/CHLAMYDIA PROBE AMP (~~LOC~~) NOT AT ARMC
Chlamydia: NEGATIVE
Neisseria Gonorrhea: POSITIVE — AB

## 2021-09-19 ENCOUNTER — Emergency Department (HOSPITAL_BASED_OUTPATIENT_CLINIC_OR_DEPARTMENT_OTHER)
Admission: EM | Admit: 2021-09-19 | Discharge: 2021-09-19 | Disposition: A | Discharge status: Home / Self Care | Payer: 59 | Attending: Emergency Medicine | Admitting: Emergency Medicine

## 2021-09-19 ENCOUNTER — Other Ambulatory Visit: Payer: Self-pay

## 2021-09-19 ENCOUNTER — Encounter (HOSPITAL_BASED_OUTPATIENT_CLINIC_OR_DEPARTMENT_OTHER): Payer: Self-pay | Admitting: Emergency Medicine

## 2021-09-19 DIAGNOSIS — Z87891 Personal history of nicotine dependence: Secondary | ICD-10-CM | POA: Insufficient documentation

## 2021-09-19 DIAGNOSIS — J45901 Unspecified asthma with (acute) exacerbation: Secondary | ICD-10-CM | POA: Diagnosis not present

## 2021-09-19 DIAGNOSIS — Z7951 Long term (current) use of inhaled steroids: Secondary | ICD-10-CM | POA: Insufficient documentation

## 2021-09-19 DIAGNOSIS — R0602 Shortness of breath: Secondary | ICD-10-CM | POA: Diagnosis present

## 2021-09-19 MED ORDER — ALBUTEROL SULFATE (2.5 MG/3ML) 0.083% IN NEBU: 2.5000 mg | INHALATION_SOLUTION | Freq: Once | RESPIRATORY_TRACT | Status: AC

## 2021-09-19 MED ORDER — FLUTICASONE FUROATE-VILANTEROL 100-25 MCG/INH IN AEPB: 1.0000 | INHALATION_SPRAY | Freq: Every day | RESPIRATORY_TRACT | 1 refills | Status: DC

## 2021-09-19 MED ORDER — IPRATROPIUM-ALBUTEROL 0.5-2.5 (3) MG/3ML IN SOLN: 3.0000 mL | Freq: Once | RESPIRATORY_TRACT | Status: AC

## 2021-09-19 MED ORDER — ALBUTEROL SULFATE HFA 108 (90 BASE) MCG/ACT IN AERS
2.0000 | INHALATION_SPRAY | Freq: Once | RESPIRATORY_TRACT | Status: AC
  Administered 2021-09-19: 09:00:00 2 via RESPIRATORY_TRACT
  Filled 2021-09-19: qty 6.7

## 2021-09-19 MED ORDER — ALBUTEROL SULFATE (2.5 MG/3ML) 0.083% IN NEBU
INHALATION_SOLUTION | RESPIRATORY_TRACT | Status: AC
  Administered 2021-09-19: 08:00:00 2.5 mg via RESPIRATORY_TRACT
  Filled 2021-09-19: qty 3

## 2021-09-19 MED ORDER — IPRATROPIUM-ALBUTEROL 0.5-2.5 (3) MG/3ML IN SOLN
RESPIRATORY_TRACT | Status: AC
  Administered 2021-09-19: 08:00:00 3 mL via RESPIRATORY_TRACT
  Filled 2021-09-19: qty 3

## 2021-09-19 MED ORDER — ALBUTEROL SULFATE (2.5 MG/3ML) 0.083% IN NEBU: 2.5000 mg | INHALATION_SOLUTION | Freq: Four times a day (QID) | RESPIRATORY_TRACT | 12 refills | Status: DC | PRN

## 2021-09-19 NOTE — ED Provider Notes (Signed)
[facility] EMERGENCY DEPARTMENT Provider Note   CSN: [id number] Arrival date & time: 09/19/21  [id number]     History Chief Complaint  Patient presents with   Shortness of Breath    Carrie Nguyen is a 40 y.o. female.  She has a history of asthma.  Complaining of increased shortness of breath and cough since running out of her Breo inhaler few days ago.  Her doctor does not take her insurance and so needs to get another primary care doctor.  No fevers or chills.  The history is provided by the patient.  Shortness of Breath Severity:  Moderate Onset quality:  Gradual Duration:  2 days Timing:  Constant Progression:  Worsening Chronicity:  Recurrent Relieved by:  Nothing Worsened by:  Activity and coughing Ineffective treatments:  None tried Associated symptoms: cough   Associated symptoms: no abdominal pain, no chest pain, no fever, no sore throat, no sputum production and no vomiting       Past Medical History:  Diagnosis Date   Anemia    Asthma     Patient Active Problem List   Diagnosis Date Noted   Hypokalemia 04/06/2016   Sepsis ([place]) 04/06/2016   Asthma exacerbation 04/06/2016   Microcytic anemia    Asthma with status asthmaticus 04/05/2016    Past Surgical History:  Procedure Laterality Date   TONSILLECTOMY     TUBAL LIGATION       OB History   No obstetric history on file.     Family History  Problem Relation Age of Onset   Lung cancer Mother    COPD Mother    Asthma Mother    Stroke Father    Hypertension Father    Diabetes Brother    Kidney disease Brother     Social History   Tobacco Use   Smoking status: Former   Smokeless tobacco: Never  Scientific laboratory technician Use: Never used  Substance Use Topics   Alcohol use: Yes    Comment: occasional   Drug use: No    Home Medications Prior to Admission medications   Medication Sig Start Date End Date Taking? Authorizing Provider  albuterol (PROVENTIL) (2.5 MG/3ML) 0.083% nebulizer  solution Take 3 mLs (2.5 mg total) by nebulization every 6 (six) hours as needed for wheezing or shortness of breath. 01/24/17   Fransico Meadow, PA-C  ciprofloxacin (CILOXAN) 0.3 % ophthalmic solution Place 2 drops into the left eye every 4 (four) hours while awake. Administer 1 drop, every 2 hours, while awake, for 2 days. Then 1 drop, every 4 hours, while awake, for the next 5 days. 04/27/19   Palumbo, April, MD  ferrous sulfate 325 (65 FE) MG tablet Take 1 tablet (325 mg total) by mouth daily. 08/15/19   Nuala Alpha A, PA-C  fluticasone furoate-vilanterol (BREO ELLIPTA) 100-25 MCG/INH AEPB Inhale 1 puff into the lungs daily.    [provider]  HYDROcodone-acetaminophen (NORCO) 5-325 MG tablet Take 1 tablet by mouth every 6 (six) hours as needed (for pain). 09/24/17   Molpus, John, MD  montelukast (SINGULAIR) 10 MG tablet Take 10 mg by mouth at bedtime.    [provider]  omeprazole (PRILOSEC) 20 MG capsule Take 1 capsule (20 mg total) by mouth daily. 12/30/17   Molpus, John, MD  potassium chloride (KLOR-CON) 10 MEQ tablet Take 3 tablets (30 mEq total) by mouth daily for 4 days. 08/15/19 08/19/19  Deliah Boston, PA-C    Allergies  Patient has no known allergies.  Review of Systems   Review of Systems  Constitutional:  Negative for fever.  HENT:  Negative for sore throat.   Eyes:  Negative for visual disturbance.  Respiratory:  Positive for cough and shortness of breath. Negative for sputum production.   Cardiovascular:  Negative for chest pain.  Gastrointestinal:  Negative for abdominal pain and vomiting.   Physical Exam Updated Vital Signs BP 109/78    Pulse 94    Temp 97.6 F (36.4 C) (Oral)    Resp (!) 22    Ht 5\' 5"  (1.651 m)    Wt 74.8 kg    LMP 09/15/2021    SpO2 98%    BMI 27.46 kg/m   Physical Exam Vitals and nursing note reviewed.  Constitutional:      General: She is not in acute distress.    Appearance: She is well-developed.  HENT:     Head:  Normocephalic and atraumatic.  Eyes:     Conjunctiva/sclera: Conjunctivae normal.  Cardiovascular:     Rate and Rhythm: Normal rate and regular rhythm.     Heart sounds: No murmur heard. Pulmonary:     Effort: Pulmonary effort is normal. No respiratory distress.     Breath sounds: Normal breath sounds.  Abdominal:     Palpations: Abdomen is soft.     Tenderness: There is no abdominal tenderness.  Musculoskeletal:        General: No swelling.     Cervical back: Neck supple.  Skin:    General: Skin is warm and dry.     Capillary Refill: Capillary refill takes less than 2 seconds.  Neurological:     General: No focal deficit present.     Mental Status: She is alert.    ED Results / Procedures / Treatments   Labs (all labs ordered are listed, but only abnormal results are displayed) Labs Reviewed - No data to display  EKG None  Radiology No results found.  Procedures Procedures   Medications Ordered in ED Medications  ipratropium-albuterol (DUONEB) 0.5-2.5 (3) MG/3ML nebulizer solution 3 mL (3 mLs Nebulization Given 09/19/21 0822)  albuterol (PROVENTIL) (2.5 MG/3ML) 0.083% nebulizer solution 2.5 mg (2.5 mg Nebulization Given 09/19/21 [id number])    ED Course  I have reviewed the triage vital signs and the nursing notes.  Pertinent labs & imaging results that were available during my care of the patient were reviewed by me and considered in my medical decision making (see chart for details).  Clinical Course as of 09/19/21 1806  Fri Sep 19, 2021  0854 Patient received a DuoNeb here with improvement in her breathing.  She said she feels back to baseline.  We will restart her Breo inhaler. [MB]    Clinical Course User Index [MB] Hayden Rasmussen, MD   MDM Rules/Calculators/A&P                             Final Clinical Impression(s) / ED Diagnoses Final diagnoses:  Moderate asthma with exacerbation, unspecified whether persistent    Rx / DC Orders ED Discharge  Orders          Ordered    fluticasone furoate-vilanterol (BREO ELLIPTA) 100-25 MCG/INH AEPB  Daily        09/19/21 0854    albuterol (PROVENTIL) (2.5 MG/3ML) 0.083% nebulizer solution  Every 6 hours PRN        09/19/21 0902  Terrilee Files, MD 09/19/21 [phone]

## 2021-09-19 NOTE — ED Triage Notes (Signed)
Pt has asthma and has run out of inhalers x 4 days.  Has to get new MD.  Pt states sob started yesterday and worse this am.  Pt has audible wheezing.

## 2022-01-16 ENCOUNTER — Encounter (HOSPITAL_BASED_OUTPATIENT_CLINIC_OR_DEPARTMENT_OTHER): Payer: Self-pay

## 2022-01-16 ENCOUNTER — Emergency Department (HOSPITAL_BASED_OUTPATIENT_CLINIC_OR_DEPARTMENT_OTHER)
Admission: EM | Admit: 2022-01-16 | Discharge: 2022-01-16 | Disposition: A | Discharge status: Home / Self Care | Payer: 59 | Attending: Emergency Medicine | Admitting: Emergency Medicine

## 2022-01-16 ENCOUNTER — Other Ambulatory Visit: Payer: Self-pay

## 2022-01-16 DIAGNOSIS — Z7951 Long term (current) use of inhaled steroids: Secondary | ICD-10-CM | POA: Diagnosis not present

## 2022-01-16 DIAGNOSIS — J4541 Moderate persistent asthma with (acute) exacerbation: Secondary | ICD-10-CM | POA: Diagnosis not present

## 2022-01-16 DIAGNOSIS — J45901 Unspecified asthma with (acute) exacerbation: Secondary | ICD-10-CM | POA: Diagnosis present

## 2022-01-16 MED ORDER — ALBUTEROL SULFATE HFA 108 (90 BASE) MCG/ACT IN AERS
2.0000 | INHALATION_SPRAY | Freq: Once | RESPIRATORY_TRACT | Status: AC
  Administered 2022-01-16: 2 via RESPIRATORY_TRACT
  Filled 2022-01-16: qty 6.7

## 2022-01-16 MED ORDER — ALBUTEROL SULFATE (2.5 MG/3ML) 0.083% IN NEBU
INHALATION_SOLUTION | RESPIRATORY_TRACT | Status: AC
  Filled 2022-01-16: qty 6

## 2022-01-16 MED ORDER — ALBUTEROL SULFATE (2.5 MG/3ML) 0.083% IN NEBU
5.0000 mg | INHALATION_SOLUTION | Freq: Once | RESPIRATORY_TRACT | Status: AC
  Administered 2022-01-16: 5 mg via RESPIRATORY_TRACT

## 2022-01-16 MED ORDER — IPRATROPIUM-ALBUTEROL 0.5-2.5 (3) MG/3ML IN SOLN
RESPIRATORY_TRACT | Status: AC
  Administered 2022-01-16: 3 mL via RESPIRATORY_TRACT
  Filled 2022-01-16: qty 3

## 2022-01-16 MED ORDER — AEROCHAMBER PLUS FLO-VU MISC
1.0000 | Freq: Once | Status: AC
  Administered 2022-01-16: 1
  Filled 2022-01-16: qty 1

## 2022-01-16 MED ORDER — IPRATROPIUM-ALBUTEROL 0.5-2.5 (3) MG/3ML IN SOLN: 3.0000 mL | Freq: Once | RESPIRATORY_TRACT | Status: AC

## 2022-01-16 MED ORDER — ALBUTEROL SULFATE (2.5 MG/3ML) 0.083% IN NEBU
INHALATION_SOLUTION | RESPIRATORY_TRACT | Status: AC
  Administered 2022-01-16: 2.5 mg via RESPIRATORY_TRACT
  Filled 2022-01-16: qty 3

## 2022-01-16 MED ORDER — DEXAMETHASONE 4 MG PO TABS
10.0000 mg | ORAL_TABLET | Freq: Once | ORAL | Status: AC
Start: 2022-01-16 — End: 2022-01-16
  Administered 2022-01-16: 10 mg via ORAL
  Filled 2022-01-16: qty 3

## 2022-01-16 MED ORDER — ALBUTEROL SULFATE (2.5 MG/3ML) 0.083% IN NEBU: 2.5000 mg | INHALATION_SOLUTION | Freq: Once | RESPIRATORY_TRACT | Status: AC

## 2022-01-16 NOTE — ED Provider Notes (Signed)
?[facility] EMERGENCY DEPARTMENT ?Provider Note ? ? ?CSN: [id number] ?Arrival date & time: 01/16/22  0017 ? ?  ? ?History ? ?Chief Complaint  ?Patient presents with  ? Asthma  ? ? ?Carrie Nguyen is a 41 y.o. female. ? ?41 yo F with a chief complaints of an asthma exacerbation.  Going on for about a week now.  She has been taking her Breo but had run out of her albuterol inhaler.  She has not been able to see her family doctor back in the office.  Is planning on establishing care with a different provider.  She denies any fevers.  No known sick contacts. ? ? ?Asthma ? ? ?  ? ?Home Medications ?Prior to Admission medications   ?Medication Sig Start Date End Date Taking? Authorizing Provider  ?albuterol (PROVENTIL) (2.5 MG/3ML) 0.083% nebulizer solution Take 3 mLs (2.5 mg total) by nebulization every 6 (six) hours as needed for wheezing or shortness of breath. 09/19/21   Terrilee Files, MD  ?ciprofloxacin (CILOXAN) 0.3 % ophthalmic solution Place 2 drops into the left eye every 4 (four) hours while awake. Administer 1 drop, every 2 hours, while awake, for 2 days. Then 1 drop, every 4 hours, while awake, for the next 5 days. 04/27/19   Palumbo, April, MD  ?ferrous sulfate 325 (65 FE) MG tablet Take 1 tablet (325 mg total) by mouth daily. 08/15/19   Harlene Salts A, PA-C  ?fluticasone furoate-vilanterol (BREO ELLIPTA) 100-25 MCG/INH AEPB Inhale 1 puff into the lungs daily. 09/19/21   Terrilee Files, MD  ?HYDROcodone-acetaminophen (NORCO) 5-325 MG tablet Take 1 tablet by mouth every 6 (six) hours as needed (for pain). 09/24/17   Molpus, John, MD  ?montelukast (SINGULAIR) 10 MG tablet Take 10 mg by mouth at bedtime.    [provider]  ?omeprazole (PRILOSEC) 20 MG capsule Take 1 capsule (20 mg total) by mouth daily. 12/30/17   Molpus, John, MD  ?potassium chloride (KLOR-CON) 10 MEQ tablet Take 3 tablets (30 mEq total) by mouth daily for 4 days. 08/15/19 08/19/19  Bill Salinas, PA-C  ?    ? ?Allergies    ?Patient has no known allergies.   ? ?Review of Systems   ?Review of Systems ? ?Physical Exam ?Updated Vital Signs ?BP 122/77 (BP Location: Left Arm)   Pulse (!) 107   Temp 98.8 ?F (37.1 ?C) (Oral)   Resp (!) 22   Ht 5\' 5"  (1.651 m)   Wt 72.6 kg   LMP  (Within Weeks)   SpO2 97%   BMI 26.63 kg/m?  ?Physical Exam ?Vitals and nursing note reviewed.  ?Constitutional:   ?   General: She is not in acute distress. ?   Appearance: She is well-developed. She is not diaphoretic.  ?HENT:  ?   Head: Normocephalic and atraumatic.  ?Eyes:  ?   Pupils: Pupils are equal, round, and reactive to light.  ?Cardiovascular:  ?   Rate and Rhythm: Normal rate and regular rhythm.  ?   Heart sounds: No murmur heard. ?  No friction rub. No gallop.  ?Pulmonary:  ?   Effort: Pulmonary effort is normal.  ?   Breath sounds: Wheezing present. No rales.  ?   Comments: Diffuse wheezes in all lung fields with prolonged expiratory effort. ?Abdominal:  ?   General: There is no distension.  ?   Palpations: Abdomen is soft.  ?   Tenderness: There is no abdominal tenderness.  ?Musculoskeletal:     ?  General: No tenderness.  ?   Cervical back: Normal range of motion and neck supple.  ?Skin: ?   General: Skin is warm and dry.  ?Neurological:  ?   Mental Status: She is alert and oriented to person, place, and time.  ?Psychiatric:     ?   Behavior: Behavior normal.  ? ? ?ED Results / Procedures / Treatments   ?Labs ?(all labs ordered are listed, but only abnormal results are displayed) ?Labs Reviewed - No data to display ? ?EKG ?None ? ?Radiology ?No results found. ? ?Procedures ?Procedures  ? ? ?Medications Ordered in ED ?Medications  ?albuterol (VENTOLIN HFA) 108 (90 Base) MCG/ACT inhaler 2 puff (has no administration in time range)  ?aerochamber plus with mask device 1 each (has no administration in time range)  ?ipratropium-albuterol (DUONEB) 0.5-2.5 (3) MG/3ML nebulizer solution 3 mL (3 mLs Nebulization Given 01/16/22 0032)   ?albuterol (PROVENTIL) (2.5 MG/3ML) 0.083% nebulizer solution 2.5 mg (2.5 mg Nebulization Given 01/16/22 0031)  ?dexamethasone (DECADRON) tablet 10 mg (10 mg Oral Given 01/16/22 0103)  ?albuterol (PROVENTIL) (2.5 MG/3ML) 0.083% nebulizer solution 5 mg (5 mg Nebulization Not Given 01/16/22 0107)  ? ? ?ED Course/ Medical Decision Making/ A&P ?  ?                        ?Medical Decision Making ?Risk ?Prescription drug management. ? ? ?41 yo F with a chief complaints of an asthma exacerbation.  Been going on for about a week now.  No significant tachypnea.  Has diffuse wheezes and prolonged expiratory effort.  We will give 2 DuoNebs here.  We will give a dose of Decadron.  Reassess. ? ?Patient with some persistent wheezing was given a third dose. ? ?On reassessment patient feeling quite a bit better and would like to go home.  We will give her 2 puffs of an albuterol inhaler and let her take at home as she had run out of this.  Encouraged PCP follow-up. ? ?1:08 AM:  I have discussed the diagnosis/risks/treatment options with the patient.  Evaluation and diagnostic testing in the emergency department does not suggest an emergent condition requiring admission or immediate intervention beyond what has been performed at this time.  They will follow up with  PCP. We also discussed returning to the ED immediately if new or worsening sx occur. We discussed the sx which are most concerning (e.g., sudden worsening sob, fever, inability to tolerate by mouth, need to use inhaler more often than every 4 hours) that necessitate immediate return. Medications administered to the patient during their visit and any new prescriptions provided to the patient are listed below. ? ?Medications given during this visit ?Medications  ?albuterol (VENTOLIN HFA) 108 (90 Base) MCG/ACT inhaler 2 puff (has no administration in time range)  ?aerochamber plus with mask device 1 each (has no administration in time range)  ?ipratropium-albuterol (DUONEB)  0.5-2.5 (3) MG/3ML nebulizer solution 3 mL (3 mLs Nebulization Given 01/16/22 0032)  ?albuterol (PROVENTIL) (2.5 MG/3ML) 0.083% nebulizer solution 2.5 mg (2.5 mg Nebulization Given 01/16/22 0031)  ?dexamethasone (DECADRON) tablet 10 mg (10 mg Oral Given 01/16/22 0103)  ?albuterol (PROVENTIL) (2.5 MG/3ML) 0.083% nebulizer solution 5 mg (5 mg Nebulization Not Given 01/16/22 0107)  ? ? ? ?The patient appears reasonably screen and/or stabilized for discharge and I doubt any other medical condition or other [facility] requiring further screening, evaluation, or treatment in the ED at this time prior to discharge.  ? ? ? ? ? ? ? ? ?  Final Clinical Impression(s) / ED Diagnoses ?Final diagnoses:  ?Moderate persistent asthma with exacerbation  ? ? ?Rx / DC Orders ?ED Discharge Orders   ? ? None  ? ?  ? ? ?  ?Melene Plan, DO ?01/16/22 0108 ? ?

## 2022-01-16 NOTE — ED Triage Notes (Signed)
Difficulty breathing for approximately 1 week. Audibly wheezing. Ran out of Albuterol inhaler.  ?

## 2022-01-16 NOTE — Discharge Instructions (Signed)
Use your inhaler every 4 hours(6 puffs) while awake, return for sudden worsening shortness of breath, or if you need to use your inhaler more often.  ° °

## 2022-02-22 ENCOUNTER — Other Ambulatory Visit: Payer: Self-pay

## 2022-02-22 ENCOUNTER — Emergency Department (HOSPITAL_BASED_OUTPATIENT_CLINIC_OR_DEPARTMENT_OTHER)
Admission: EM | Admit: 2022-02-22 | Discharge: 2022-02-22 | Disposition: A | Discharge status: Home / Self Care | Payer: 59 | Attending: Emergency Medicine | Admitting: Emergency Medicine

## 2022-02-22 ENCOUNTER — Emergency Department (HOSPITAL_BASED_OUTPATIENT_CLINIC_OR_DEPARTMENT_OTHER): Payer: 59

## 2022-02-22 ENCOUNTER — Encounter (HOSPITAL_BASED_OUTPATIENT_CLINIC_OR_DEPARTMENT_OTHER): Payer: Self-pay | Admitting: Emergency Medicine

## 2022-02-22 DIAGNOSIS — J4541 Moderate persistent asthma with (acute) exacerbation: Secondary | ICD-10-CM

## 2022-02-22 DIAGNOSIS — R0602 Shortness of breath: Secondary | ICD-10-CM | POA: Insufficient documentation

## 2022-02-22 DIAGNOSIS — R059 Cough, unspecified: Secondary | ICD-10-CM | POA: Diagnosis not present

## 2022-02-22 DIAGNOSIS — Z7951 Long term (current) use of inhaled steroids: Secondary | ICD-10-CM | POA: Insufficient documentation

## 2022-02-22 DIAGNOSIS — J45909 Unspecified asthma, uncomplicated: Secondary | ICD-10-CM | POA: Diagnosis not present

## 2022-02-22 MED ORDER — PREDNISONE 20 MG PO TABS: ORAL_TABLET | ORAL | 0 refills | Status: DC

## 2022-02-22 MED ORDER — ALBUTEROL SULFATE (2.5 MG/3ML) 0.083% IN NEBU
2.5000 mg | INHALATION_SOLUTION | Freq: Once | RESPIRATORY_TRACT | Status: AC
Start: 2022-02-22 — End: 2022-02-22
  Administered 2022-02-22: 2.5 mg via RESPIRATORY_TRACT
  Filled 2022-02-22: qty 3

## 2022-02-22 MED ORDER — PREDNISONE 50 MG PO TABS
60.0000 mg | ORAL_TABLET | Freq: Once | ORAL | Status: AC
Start: 2022-02-22 — End: 2022-02-22
  Administered 2022-02-22: 60 mg via ORAL
  Filled 2022-02-22: qty 1

## 2022-02-22 MED ORDER — ALBUTEROL SULFATE (2.5 MG/3ML) 0.083% IN NEBU
5.0000 mg | INHALATION_SOLUTION | Freq: Once | RESPIRATORY_TRACT | Status: AC
  Administered 2022-02-22: 5 mg via RESPIRATORY_TRACT
  Filled 2022-02-22: qty 6

## 2022-02-22 MED ORDER — IPRATROPIUM-ALBUTEROL 0.5-2.5 (3) MG/3ML IN SOLN
3.0000 mL | Freq: Once | RESPIRATORY_TRACT | Status: AC
  Administered 2022-02-22: 3 mL via RESPIRATORY_TRACT
  Filled 2022-02-22: qty 3

## 2022-02-22 MED ORDER — FLUTICASONE FUROATE-VILANTEROL 100-25 MCG/INH IN AEPB: 1.0000 | INHALATION_SPRAY | Freq: Every day | RESPIRATORY_TRACT | 1 refills | Status: DC

## 2022-02-22 MED ORDER — ALBUTEROL SULFATE HFA 108 (90 BASE) MCG/ACT IN AERS
2.0000 | INHALATION_SPRAY | RESPIRATORY_TRACT | Status: DC | PRN
  Administered 2022-02-22: 2 via RESPIRATORY_TRACT
  Filled 2022-02-22: qty 6.7

## 2022-02-22 NOTE — Discharge Instructions (Signed)
Begin taking prednisone as prescribed.  Continue your inhalers and nebulizer as previously recommended.  Return to the emergency department if you develop severe chest pain, worsening breathing, high fevers, or for other new and concerning symptoms.

## 2022-02-22 NOTE — ED Provider Notes (Signed)
[facility] EMERGENCY DEPARTMENT Provider Note   CSN: [id number] Arrival date & time: 02/22/22  0046     History  Chief Complaint  Patient presents with   Shortness of Breath    Carrie Nguyen is a 41 y.o. female.  Patient is a 41 year old female with history of asthma presenting with complaints of wheezing and difficulty breathing.  This has worsened over the past several days.  She describes some cough that is nonproductive.  She denies any fevers, chills, or chest pain.  She has been using her nebulizer at home with little relief.  She has been on an inhaled steroid in the past, however no longer has a primary doctor due to insurance changes.  She is also out of her albuterol inhaler.  The history is provided by the patient.  Shortness of Breath Severity:  Moderate Onset quality:  Gradual Duration:  1 week Timing:  Constant Progression:  Worsening Chronicity:  New     Home Medications Prior to Admission medications   Medication Sig Start Date End Date Taking? Authorizing Provider  albuterol (PROVENTIL) (2.5 MG/3ML) 0.083% nebulizer solution Take 3 mLs (2.5 mg total) by nebulization every 6 (six) hours as needed for wheezing or shortness of breath. 09/19/21   Terrilee Files, MD  ciprofloxacin (CILOXAN) 0.3 % ophthalmic solution Place 2 drops into the left eye every 4 (four) hours while awake. Administer 1 drop, every 2 hours, while awake, for 2 days. Then 1 drop, every 4 hours, while awake, for the next 5 days. 04/27/19   Palumbo, April, MD  ferrous sulfate 325 (65 FE) MG tablet Take 1 tablet (325 mg total) by mouth daily. 08/15/19   Harlene Salts A, PA-C  fluticasone furoate-vilanterol (BREO ELLIPTA) 100-25 MCG/INH AEPB Inhale 1 puff into the lungs daily. 09/19/21   Terrilee Files, MD  HYDROcodone-acetaminophen (NORCO) 5-325 MG tablet Take 1 tablet by mouth every 6 (six) hours as needed (for pain). 09/24/17   Molpus, John, MD  montelukast (SINGULAIR) 10 MG tablet  Take 10 mg by mouth at bedtime.    [provider]  omeprazole (PRILOSEC) 20 MG capsule Take 1 capsule (20 mg total) by mouth daily. 12/30/17   Molpus, John, MD  potassium chloride (KLOR-CON) 10 MEQ tablet Take 3 tablets (30 mEq total) by mouth daily for 4 days. 08/15/19 08/19/19  Bill Salinas, PA-C      Allergies    Patient has no known allergies.    Review of Systems   Review of Systems  Respiratory:  Positive for shortness of breath.   All other systems reviewed and are negative.  Physical Exam Updated Vital Signs BP (!) 129/95   Pulse 93   Temp 98.1 F (36.7 C)   Resp 20   Ht 5\' 5"  (1.651 m)   Wt 68.5 kg   LMP 01/26/2022   SpO2 95%   BMI 25.13 kg/m  Physical Exam Vitals and nursing note reviewed.  Constitutional:      General: She is not in acute distress.    Appearance: She is well-developed. She is not diaphoretic.  HENT:     Head: Normocephalic and atraumatic.  Cardiovascular:     Rate and Rhythm: Normal rate and regular rhythm.     Heart sounds: No murmur heard.   No friction rub. No gallop.  Pulmonary:     Effort: Pulmonary effort is normal. No tachypnea or respiratory distress.     Breath sounds: Examination of the right-middle field reveals  rhonchi. Examination of the left-middle field reveals rhonchi. Rhonchi present. No wheezing.  Abdominal:     General: Bowel sounds are normal. There is no distension.     Palpations: Abdomen is soft.     Tenderness: There is no abdominal tenderness.  Musculoskeletal:        General: Normal range of motion.     Cervical back: Normal range of motion and neck supple.  Skin:    General: Skin is warm and dry.  Neurological:     General: No focal deficit present.     Mental Status: She is alert and oriented to person, place, and time.    ED Results / Procedures / Treatments   Labs (all labs ordered are listed, but only abnormal results are displayed) Labs Reviewed - No data to display  EKG EKG  Interpretation  Date/Time:  Sunday February 22 2022 00:54:15 EDT Ventricular Rate:  92 PR Interval:  164 QRS Duration: 70 QT Interval:  352 QTC Calculation: 435 R Axis:   57 Text Interpretation: Normal sinus rhythm with sinus arrhythmia Nonspecific T wave abnormality Abnormal ECG When compared with ECG of 15-Aug-2019 19:07, no significant change is noted Confirmed by Geoffery Lyons ([id number]) on 02/22/2022 1:50:17 AM  Radiology DG Chest 2 View  Result Date: 02/22/2022 CLINICAL DATA:  Shortness of for 3 days history of asthma. EXAM: CHEST - 2 VIEW COMPARISON:  12/30/2017 FINDINGS: Mild peribronchial thickening with borderline hyperinflation. No focal airspace disease. The heart is normal in size with normal mediastinal contours. No pneumothorax and pneumomediastinum. No pleural fluid. Thoracic scoliosis. IMPRESSION: Mild peribronchial thickening with borderline hyperinflation, can be seen with asthma or bronchitis. Electronically Signed   By: Narda Rutherford M.D.   On: 02/22/2022 01:49    Procedures Procedures    Medications Ordered in ED Medications  albuterol (VENTOLIN HFA) 108 (90 Base) MCG/ACT inhaler 2 puff (has no administration in time range)  predniSONE (DELTASONE) tablet 60 mg (has no administration in time range)  albuterol (PROVENTIL) (2.5 MG/3ML) 0.083% nebulizer solution 2.5 mg (2.5 mg Nebulization Given 02/22/22 0109)  ipratropium-albuterol (DUONEB) 0.5-2.5 (3) MG/3ML nebulizer solution 3 mL (3 mLs Nebulization Given 02/22/22 0109)  albuterol (PROVENTIL) (2.5 MG/3ML) 0.083% nebulizer solution 5 mg (5 mg Nebulization Given 02/22/22 0150)    ED Course/ Medical Decision Making/ A&P  Patient is a 41 year old female presenting with complaints of wheezing and shortness of breath.  This has been worsening over the past several days.  She has history of asthma and has been out of her inhaled steroid.  She has been doing breathing treatments at home with minimal relief.  She arrives here  wheezing, but with stable vital signs and no hypoxia.  Patient received 2 albuterol treatments and 60 mg of prednisone and is feeling significantly improved.  Her chest x-ray shows no acute process, just asthma versus bronchitis.  She has no fever and no productive cough and I doubt an infectious etiology.  Patient to be discharged with prednisone, continued use of her nebulizer.  She was given an albuterol inhaler here and will be prescribed her Breo inhaler.  Final Clinical Impression(s) / ED Diagnoses Final diagnoses:  None    Rx / DC Orders ED Discharge Orders     None         Geoffery Lyons, MD 02/22/22 [phone]

## 2022-02-22 NOTE — ED Triage Notes (Signed)
Pt c/o SHOB x 3 days. Pt states that she has tried her nebs without relief. Pt is out of her inhaler and maintenance meds.

## 2022-06-23 ENCOUNTER — Other Ambulatory Visit: Payer: Self-pay

## 2022-06-23 ENCOUNTER — Emergency Department (HOSPITAL_BASED_OUTPATIENT_CLINIC_OR_DEPARTMENT_OTHER): Payer: 59

## 2022-06-23 ENCOUNTER — Emergency Department (HOSPITAL_BASED_OUTPATIENT_CLINIC_OR_DEPARTMENT_OTHER)
Admission: EM | Admit: 2022-06-23 | Discharge: 2022-06-23 | Disposition: A | Discharge status: Home / Self Care | Payer: 59 | Attending: Emergency Medicine | Admitting: Emergency Medicine

## 2022-06-23 ENCOUNTER — Encounter (HOSPITAL_BASED_OUTPATIENT_CLINIC_OR_DEPARTMENT_OTHER): Payer: Self-pay

## 2022-06-23 DIAGNOSIS — Z7951 Long term (current) use of inhaled steroids: Secondary | ICD-10-CM | POA: Diagnosis not present

## 2022-06-23 DIAGNOSIS — R000 Tachycardia, unspecified: Secondary | ICD-10-CM | POA: Insufficient documentation

## 2022-06-23 DIAGNOSIS — J4541 Moderate persistent asthma with (acute) exacerbation: Secondary | ICD-10-CM | POA: Diagnosis not present

## 2022-06-23 DIAGNOSIS — R0602 Shortness of breath: Secondary | ICD-10-CM | POA: Diagnosis present

## 2022-06-23 MED ORDER — PREDNISONE 50 MG PO TABS: 50.0000 mg | ORAL_TABLET | Freq: Every day | ORAL | 0 refills | Status: DC

## 2022-06-23 MED ORDER — ALBUTEROL SULFATE (2.5 MG/3ML) 0.083% IN NEBU: 2.5000 mg | INHALATION_SOLUTION | Freq: Four times a day (QID) | RESPIRATORY_TRACT | 0 refills | Status: AC | PRN

## 2022-06-23 MED ORDER — ALBUTEROL SULFATE HFA 108 (90 BASE) MCG/ACT IN AERS
2.0000 | INHALATION_SPRAY | RESPIRATORY_TRACT | Status: DC | PRN
  Administered 2022-06-23: 2 via RESPIRATORY_TRACT
  Filled 2022-06-23: qty 6.7

## 2022-06-23 MED ORDER — FLUTICASONE FUROATE-VILANTEROL 100-25 MCG/INH IN AEPB: 1.0000 | INHALATION_SPRAY | Freq: Every day | RESPIRATORY_TRACT | 0 refills | Status: AC

## 2022-06-23 MED ORDER — ALBUTEROL SULFATE (2.5 MG/3ML) 0.083% IN NEBU
7.5000 mg | INHALATION_SOLUTION | Freq: Once | RESPIRATORY_TRACT | Status: AC
  Administered 2022-06-23: 7.5 mg via RESPIRATORY_TRACT
  Filled 2022-06-23: qty 9

## 2022-06-23 MED ORDER — IPRATROPIUM-ALBUTEROL 0.5-2.5 (3) MG/3ML IN SOLN
3.0000 mL | Freq: Once | RESPIRATORY_TRACT | Status: AC
  Administered 2022-06-23: 3 mL via RESPIRATORY_TRACT
  Filled 2022-06-23: qty 3

## 2022-06-23 MED ORDER — ALBUTEROL SULFATE HFA 108 (90 BASE) MCG/ACT IN AERS: 2.0000 | INHALATION_SPRAY | RESPIRATORY_TRACT | 0 refills | Status: AC | PRN

## 2022-06-23 MED ORDER — METHYLPREDNISOLONE SODIUM SUCC 125 MG IJ SOLR
125.0000 mg | Freq: Once | INTRAMUSCULAR | Status: AC
  Administered 2022-06-23: 125 mg via INTRAVENOUS
  Filled 2022-06-23: qty 2

## 2022-06-23 NOTE — ED Provider Notes (Signed)
[place] EMERGENCY DEPARTMENT Provider Note   CSN: [id number] Arrival date & time: 06/23/22  0013     History  Chief Complaint  Patient presents with   Shortness of Breath    Carrie Nguyen is a 41 y.o. female.  The history is provided by the patient.  Shortness of Breath She has history of asthma and comes in complaining of an asthma attack for the last 10 days.  She was generally doing well with using her rescue inhaler, but the medication and the inhaler ran out yesterday and she has not had any medication for the last day and a half.  She denies fever, chills, sweats.  She has had cough productive of some clear sputum.  She states that she also has run out of her Breo elliptica inhaler.  Her primary care provider is no longer taking her insurance and she has not found another provider.   Home Medications Prior to Admission medications   Medication Sig Start Date End Date Taking? Authorizing Provider  albuterol (PROVENTIL) (2.5 MG/3ML) 0.083% nebulizer solution Take 3 mLs (2.5 mg total) by nebulization every 6 (six) hours as needed for wheezing or shortness of breath. 09/19/21   Hayden Rasmussen, MD  ciprofloxacin (CILOXAN) 0.3 % ophthalmic solution Place 2 drops into the left eye every 4 (four) hours while awake. Administer 1 drop, every 2 hours, while awake, for 2 days. Then 1 drop, every 4 hours, while awake, for the next 5 days. 04/27/19   Palumbo, April, MD  ferrous sulfate 325 (65 FE) MG tablet Take 1 tablet (325 mg total) by mouth daily. 08/15/19   Nuala Alpha A, PA-C  fluticasone furoate-vilanterol (BREO ELLIPTA) 100-25 MCG/INH AEPB Inhale 1 puff into the lungs daily. 02/22/22   Veryl Speak, MD  HYDROcodone-acetaminophen (NORCO) 5-325 MG tablet Take 1 tablet by mouth every 6 (six) hours as needed (for pain). 09/24/17   Molpus, John, MD  montelukast (SINGULAIR) 10 MG tablet Take 10 mg by mouth at bedtime.    [provider]  omeprazole (PRILOSEC) 20 MG  capsule Take 1 capsule (20 mg total) by mouth daily. 12/30/17   Molpus, John, MD  potassium chloride (KLOR-CON) 10 MEQ tablet Take 3 tablets (30 mEq total) by mouth daily for 4 days. 08/15/19 08/19/19  Nuala Alpha A, PA-C  predniSONE (DELTASONE) 20 MG tablet 3 Tabs PO Days 1-3, then 2 tabs PO Days 4-6, then 1 tab PO Day 7-9, then Half Tab PO Day 10-12 02/22/22   Veryl Speak, MD      Allergies    Patient has no known allergies.    Review of Systems   Review of Systems  Respiratory:  Positive for shortness of breath.   All other systems reviewed and are negative.   Physical Exam Updated Vital Signs BP 110/73 (BP Location: Left Arm)   Pulse (!) 117   Temp 98.4 F (36.9 C) (Oral)   Resp (!) 48   Ht 5\' 5"  (1.651 m)   Wt 68 kg   SpO2 94%   BMI 24.96 kg/m  Physical Exam Vitals and nursing note reviewed.   41 year old female, resting comfortably and in no acute distress. Vital signs are significant for rapid heart rate and respiratory rate. Oxygen saturation is 94%, which is normal. Head is normocephalic and atraumatic. PERRLA, EOMI. Oropharynx is clear. Neck is nontender and supple without adenopathy or JVD. Back is nontender and there is no CVA tenderness. Lungs have diffuse inspiratory and expiratory  wheezes without rales or rhonchi.  She is speaking in complete sentences and not using accessory muscles of respiration. Chest is nontender. Heart has regular rate and rhythm without murmur. Abdomen is soft, flat, nontender. Extremities have no cyanosis or edema, full range of motion is present. Skin is warm and dry without rash. Neurologic: Mental status is normal, cranial nerves are intact, moves all extremities equally.  ED Results / Procedures / Treatments    EKG EKG Interpretation  Date/Time:  Tuesday June 23 2022 00:22:34 EDT Ventricular Rate:  117 PR Interval:  162 QRS Duration: 70 QT Interval:  316 QTC Calculation: 440 R Axis:   82 Text Interpretation: Sinus  tachycardia Nonspecific T wave abnormality Abnormal ECG When compared with ECG of 22-Feb-2022 00:54, HEART RATE has increased Confirmed by Delora Fuel ([id number]) on 06/23/2022 12:27:53 AM  Radiology DG Chest Portable 1 View  Result Date: 06/23/2022 CLINICAL DATA:  Shortness of breath and chest tightness for 1 week EXAM: PORTABLE CHEST 1 VIEW COMPARISON:  02/22/2022 FINDINGS: The heart size and mediastinal contours are within normal limits. Both lungs are clear. The visualized skeletal structures are unremarkable. IMPRESSION: No active disease. Electronically Signed   By: Inez Catalina M.D.   On: 06/23/2022 01:15    Procedures Procedures    Medications Ordered in ED Medications  methylPREDNISolone sodium succinate (SOLU-MEDROL) 125 mg/2 mL injection 125 mg (has no administration in time range)  ipratropium-albuterol (DUONEB) 0.5-2.5 (3) MG/3ML nebulizer solution 3 mL (has no administration in time range)  albuterol (PROVENTIL) (2.5 MG/3ML) 0.083% nebulizer solution 7.5 mg (7.5 mg Nebulization Given 06/23/22 0035)  ipratropium-albuterol (DUONEB) 0.5-2.5 (3) MG/3ML nebulizer solution 3 mL (3 mLs Nebulization Given 06/23/22 0035)    ED Course/ Medical Decision Making/ A&P                           Medical Decision Making Amount and/or Complexity of Data Reviewed Radiology: ordered.  Risk Prescription drug management.   Asthma exacerbation.  Patient had received a nebulizer treatment prior to my seeing her and has had moderate improvement.  I have ordered a dose of methylprednisolone and a second nebulizer treatment with albuterol and ipratropium.  Chest x-ray shows no evidence of pneumonia.  I have independently viewed the image, and agree with the radiologist's interpretation.  Old records are reviewed, and on 04/05/2016 she was admitted for status asthmaticus.  Following second nebulizer treatment, lungs are clear and she feels that she is back at her baseline.  I am discharging her with  prescriptions for refills for albuterol solution for her nebulizer, albuterol inhaler, Breo elliptica inhaler, and a 5-day course of prednisone.  Final Clinical Impression(s) / ED Diagnoses Final diagnoses:  Moderate persistent asthma with exacerbation    Rx / DC Orders ED Discharge Orders          Ordered    albuterol (PROVENTIL) (2.5 MG/3ML) 0.083% nebulizer solution  Every 6 hours PRN        06/23/22 0130    fluticasone furoate-vilanterol (BREO ELLIPTA) 100-25 MCG/INH AEPB  Daily        06/23/22 0130    albuterol (VENTOLIN HFA) 108 (90 Base) MCG/ACT inhaler  Every 4 hours PRN        06/23/22 0130    predniSONE (DELTASONE) 50 MG tablet  Daily        06/23/22 7619              Delora Fuel, MD  06/23/22 0157  

## 2022-06-23 NOTE — ED Triage Notes (Signed)
POV, pt c/o SOB, chest tightness x 1 wk, ran out of albuterol and neb treatments earlier today. Sts that she's been coughing up a small amount of mucous. Hx asthma. Pt alert and oriented x 4, ambulatory.

## 2022-09-08 ENCOUNTER — Emergency Department (HOSPITAL_BASED_OUTPATIENT_CLINIC_OR_DEPARTMENT_OTHER)
Admission: EM | Admit: 2022-09-08 | Discharge: 2022-09-08 | Disposition: A | Discharge status: Home / Self Care | Payer: 59 | Attending: Emergency Medicine | Admitting: Emergency Medicine

## 2022-09-08 ENCOUNTER — Encounter (HOSPITAL_BASED_OUTPATIENT_CLINIC_OR_DEPARTMENT_OTHER): Payer: Self-pay | Admitting: Emergency Medicine

## 2022-09-08 DIAGNOSIS — Z7951 Long term (current) use of inhaled steroids: Secondary | ICD-10-CM | POA: Insufficient documentation

## 2022-09-08 DIAGNOSIS — R000 Tachycardia, unspecified: Secondary | ICD-10-CM | POA: Diagnosis not present

## 2022-09-08 DIAGNOSIS — J4541 Moderate persistent asthma with (acute) exacerbation: Secondary | ICD-10-CM | POA: Diagnosis not present

## 2022-09-08 DIAGNOSIS — R0602 Shortness of breath: Secondary | ICD-10-CM | POA: Diagnosis present

## 2022-09-08 MED ORDER — ALBUTEROL SULFATE (2.5 MG/3ML) 0.083% IN NEBU
2.5000 mg | INHALATION_SOLUTION | Freq: Once | RESPIRATORY_TRACT | Status: AC
  Administered 2022-09-08: 2.5 mg via RESPIRATORY_TRACT
  Filled 2022-09-08: qty 3

## 2022-09-08 MED ORDER — IPRATROPIUM-ALBUTEROL 0.5-2.5 (3) MG/3ML IN SOLN
3.0000 mL | RESPIRATORY_TRACT | Status: DC
  Administered 2022-09-08: 3 mL via RESPIRATORY_TRACT
  Filled 2022-09-08: qty 3

## 2022-09-08 MED ORDER — PREDNISONE 20 MG PO TABS: 40.0000 mg | ORAL_TABLET | Freq: Every day | ORAL | 0 refills | Status: DC

## 2022-09-08 MED ORDER — PREDNISONE 50 MG PO TABS
60.0000 mg | ORAL_TABLET | Freq: Once | ORAL | Status: AC
  Administered 2022-09-08: 60 mg via ORAL
  Filled 2022-09-08: qty 1

## 2022-09-08 MED ORDER — ALBUTEROL SULFATE HFA 108 (90 BASE) MCG/ACT IN AERS
2.0000 | INHALATION_SPRAY | RESPIRATORY_TRACT | Status: DC | PRN
  Administered 2022-09-08: 2 via RESPIRATORY_TRACT

## 2022-09-08 MED ORDER — ALBUTEROL SULFATE HFA 108 (90 BASE) MCG/ACT IN AERS
2.0000 | INHALATION_SPRAY | Freq: Once | RESPIRATORY_TRACT | Status: DC
  Filled 2022-09-08: qty 6.7

## 2022-09-08 NOTE — ED Notes (Signed)
Discharge instructions reviewed with patient. Patient verbalizes understanding, no further questions at this time. Medications/prescriptions and follow up information provided. No acute distress noted at time of departure.  

## 2022-09-08 NOTE — ED Provider Notes (Signed)
[facility] EMERGENCY DEPARTMENT Provider Note   CSN: [id number] Arrival date & time: 09/08/22  8413     History  Chief Complaint  Patient presents with   Shortness of Breath    Carrie Nguyen is a 41 y.o. female.  Patient is a 40 year old female with a history of anemia and asthma presenting today with complaint of an asthma attack.  Patient reports that 3 days ago she ran out of her inhaler and she has had gradually worsening wheezing and cough.  She denies any fever, congestion, cold or flu symptoms.  This morning she woke up and felt very short of breath with a lot of wheezing and coughing.  She reports that she only has a rescue inhaler now because her insurance stopped covering other inhalers.  She had been on Breo before and reported that things were great when she was able to be on that inhaler and she had very few breakthrough attacks.  She has received a treatment prior to my evaluation reports now she feels much better.  She feels that she is ready to go home.  The history is provided by the patient.  Shortness of Breath      Home Medications Prior to Admission medications   Medication Sig Start Date End Date Taking? Authorizing Provider  predniSONE (DELTASONE) 20 MG tablet Take 2 tablets (40 mg total) by mouth daily. 09/08/22  Yes Gwyneth Sprout, MD  albuterol (PROVENTIL) (2.5 MG/3ML) 0.083% nebulizer solution Take 3 mLs (2.5 mg total) by nebulization every 6 (six) hours as needed for wheezing or shortness of breath. 06/23/22   Dione Booze, MD  albuterol (VENTOLIN HFA) 108 (90 Base) MCG/ACT inhaler Inhale 2 puffs into the lungs every 4 (four) hours as needed for wheezing or shortness of breath. 06/23/22   Dione Booze, MD  ferrous sulfate 325 (65 FE) MG tablet Take 1 tablet (325 mg total) by mouth daily. 08/15/19   Harlene Salts A, PA-C  fluticasone furoate-vilanterol (BREO ELLIPTA) 100-25 MCG/INH AEPB Inhale 1 puff into the lungs daily. 06/23/22   Dione Booze,  MD  montelukast (SINGULAIR) 10 MG tablet Take 10 mg by mouth at bedtime.    [provider]  omeprazole (PRILOSEC) 20 MG capsule Take 1 capsule (20 mg total) by mouth daily. 12/30/17   Molpus, Jonny Ruiz, MD      Allergies    Patient has no known allergies.    Review of Systems   Review of Systems  Respiratory:  Positive for shortness of breath.     Physical Exam Updated Vital Signs BP (!) 111/44 (BP Location: Left Arm)   Pulse (!) 103   Temp 97.6 F (36.4 C)   Resp 20   Ht 5\' 5"  (1.651 m)   Wt 61.2 kg   LMP 08/29/2022   SpO2 97%   BMI 22.47 kg/m  Physical Exam Vitals and nursing note reviewed.  Constitutional:      General: She is not in acute distress.    Appearance: She is well-developed.  HENT:     Head: Normocephalic and atraumatic.  Eyes:     Pupils: Pupils are equal, round, and reactive to light.  Cardiovascular:     Rate and Rhythm: Regular rhythm. Tachycardia present.     Heart sounds: Normal heart sounds. No murmur heard.    No friction rub.  Pulmonary:     Effort: Pulmonary effort is normal.     Breath sounds: Wheezing present. No rales.     Comments: Scant  expiratory wheezing Abdominal:     General: Bowel sounds are normal. There is no distension.     Palpations: Abdomen is soft.     Tenderness: There is no abdominal tenderness. There is no guarding or rebound.  Musculoskeletal:        General: No tenderness. Normal range of motion.     Comments: No edema  Skin:    General: Skin is warm and dry.     Findings: No rash.  Neurological:     Mental Status: She is alert and oriented to person, place, and time.     Cranial Nerves: No cranial nerve deficit.  Psychiatric:        Behavior: Behavior normal.     ED Results / Procedures / Treatments   Labs (all labs ordered are listed, but only abnormal results are displayed) Labs Reviewed - No data to display  EKG None  Radiology No results found.  Procedures Procedures    Medications  Ordered in ED Medications  ipratropium-albuterol (DUONEB) 0.5-2.5 (3) MG/3ML nebulizer solution 3 mL (3 mLs Nebulization Given 09/08/22 0700)  albuterol (VENTOLIN HFA) 108 (90 Base) MCG/ACT inhaler 2 puff (2 puffs Inhalation Not Given 09/08/22 0709)  predniSONE (DELTASONE) tablet 60 mg (has no administration in time range)  albuterol (VENTOLIN HFA) 108 (90 Base) MCG/ACT inhaler 2 puff (has no administration in time range)  albuterol (PROVENTIL) (2.5 MG/3ML) 0.083% nebulizer solution 2.5 mg (2.5 mg Nebulization Given 09/08/22 0708)    ED Course/ Medical Decision Making/ A&P                           Medical Decision Making Risk Prescription drug management.   Pt with multiple medical problems and comorbidities and presenting today with a complaint that caries a high risk for morbidity and mortality.  Here today with an asthma exacerbation.  Patient wheezing significantly upon arrival but after 1 round of albuterol and Atrovent wheezing is minimal.  Patient reports at this time feels stable for going home.  She has no cold or flu symptoms.  Low suspicion for acute cardiac etiology.  Sats are 97% on room air.  Patient given steroids as well as an albuterol inhaler to go home with.         Final Clinical Impression(s) / ED Diagnoses Final diagnoses:  Moderate persistent asthma with exacerbation    Rx / DC Orders ED Discharge Orders          Ordered    predniSONE (DELTASONE) 20 MG tablet  Daily        09/08/22 0856              Gwyneth Sprout, MD 09/08/22 [phone]

## 2022-09-08 NOTE — ED Triage Notes (Signed)
Pt states she has asthma and is having shortness of breath  Pt states she ran out of her inhaler

## 2022-10-29 ENCOUNTER — Encounter (HOSPITAL_BASED_OUTPATIENT_CLINIC_OR_DEPARTMENT_OTHER): Payer: Self-pay

## 2022-10-29 ENCOUNTER — Emergency Department (HOSPITAL_BASED_OUTPATIENT_CLINIC_OR_DEPARTMENT_OTHER)
Admission: EM | Admit: 2022-10-29 | Discharge: 2022-10-29 | Disposition: A | Discharge status: Home / Self Care | Payer: 59 | Attending: Emergency Medicine | Admitting: Emergency Medicine

## 2022-10-29 DIAGNOSIS — Z7951 Long term (current) use of inhaled steroids: Secondary | ICD-10-CM | POA: Insufficient documentation

## 2022-10-29 DIAGNOSIS — R350 Frequency of micturition: Secondary | ICD-10-CM | POA: Diagnosis not present

## 2022-10-29 DIAGNOSIS — R1012 Left upper quadrant pain: Secondary | ICD-10-CM | POA: Insufficient documentation

## 2022-10-29 DIAGNOSIS — R0602 Shortness of breath: Secondary | ICD-10-CM | POA: Diagnosis present

## 2022-10-29 DIAGNOSIS — J4541 Moderate persistent asthma with (acute) exacerbation: Secondary | ICD-10-CM | POA: Diagnosis not present

## 2022-10-29 LAB — URINALYSIS, ROUTINE W REFLEX MICROSCOPIC
Bilirubin Urine: NEGATIVE
Glucose, UA: NEGATIVE mg/dL
Hgb urine dipstick: NEGATIVE
Ketones, ur: NEGATIVE mg/dL
Nitrite: NEGATIVE
Protein, ur: NEGATIVE mg/dL
Specific Gravity, Urine: 1.02 (ref 1.005–1.030)
pH: 6 (ref 5.0–8.0)

## 2022-10-29 LAB — PREGNANCY, URINE: Preg Test, Ur: NEGATIVE

## 2022-10-29 LAB — URINALYSIS, MICROSCOPIC (REFLEX)

## 2022-10-29 MED ORDER — ALBUTEROL SULFATE HFA 108 (90 BASE) MCG/ACT IN AERS
8.0000 | INHALATION_SPRAY | Freq: Once | RESPIRATORY_TRACT | Status: AC
  Administered 2022-10-29: 8 via RESPIRATORY_TRACT
  Filled 2022-10-29: qty 6.7

## 2022-10-29 MED ORDER — PREDNISONE 50 MG PO TABS
60.0000 mg | ORAL_TABLET | Freq: Once | ORAL | Status: AC
  Administered 2022-10-29: 60 mg via ORAL
  Filled 2022-10-29: qty 1

## 2022-10-29 MED ORDER — AEROCHAMBER PLUS FLO-VU MISC
1.0000 | Freq: Once | Status: AC
  Administered 2022-10-29: 1
  Filled 2022-10-29: qty 1

## 2022-10-29 MED ORDER — PREDNISONE 20 MG PO TABS: ORAL_TABLET | ORAL | 0 refills | Status: AC

## 2022-10-29 MED ORDER — FOSFOMYCIN TROMETHAMINE 3 G PO PACK
3.0000 g | PACK | Freq: Once | ORAL | Status: AC
  Administered 2022-10-29: 3 g via ORAL
  Filled 2022-10-29: qty 3

## 2022-10-29 NOTE — Discharge Instructions (Signed)
Follow-up with your doctor in the office.  Return for worsening area symptoms fever or pain in your side. Below are max dosing that I would have you use for your asthma.  This will make you jittery. Use your inhaler every 4 hours(6 puffs) while awake, return for sudden worsening shortness of breath, or if you need to use your inhaler more often.

## 2022-10-29 NOTE — ED Provider Notes (Signed)
[facility] Provider Note   CSN: [id number] Arrival date & time: 10/29/22  1112     History  Chief Complaint  Patient presents with   Shortness of Breath   Urinary Frequency    Carrie Nguyen is a 42 y.o. female.  42 yo F with a chief complaint of foul-smelling urine and difficulty breathing.  She tells me that she has been having trouble breathing ever since she ran about her albuterol about 3 to 4 days ago.  She notes that her urine is foul-smelling.  She has had some left upper quadrant pain that seems to come and go.  Denies flank pain denies fevers denies dysuria.   Shortness of Breath Urinary Frequency Associated symptoms include shortness of breath.       Home Medications Prior to Admission medications   Medication Sig Start Date End Date Taking? Authorizing Provider  predniSONE (DELTASONE) 20 MG tablet 2 tabs po daily x 4 days 10/29/22  Yes Deno Etienne, DO  albuterol (PROVENTIL) (2.5 MG/3ML) 0.083% nebulizer solution Take 3 mLs (2.5 mg total) by nebulization every 6 (six) hours as needed for wheezing or shortness of breath. 87/6/81   Delora Fuel, MD  albuterol (VENTOLIN HFA) 108 (90 Base) MCG/ACT inhaler Inhale 2 puffs into the lungs every 4 (four) hours as needed for wheezing or shortness of breath. 15/7/26   Delora Fuel, MD  ferrous sulfate 325 (65 FE) MG tablet Take 1 tablet (325 mg total) by mouth daily. 08/15/19   Nuala Alpha A, PA-C  fluticasone furoate-vilanterol (BREO ELLIPTA) 100-25 MCG/INH AEPB Inhale 1 puff into the lungs daily. 20/3/55   Delora Fuel, MD  montelukast (SINGULAIR) 10 MG tablet Take 10 mg by mouth at bedtime.    [provider]  omeprazole (PRILOSEC) 20 MG capsule Take 1 capsule (20 mg total) by mouth daily. 12/30/17   Molpus, Jenny Reichmann, MD      Allergies    Patient has no known allergies.    Review of Systems   Review of Systems  Respiratory:  Positive for shortness of breath.    Genitourinary:  Positive for frequency.    Physical Exam Updated Vital Signs BP (!) 122/52 (BP Location: Left Arm)   Pulse 100   Temp 97.9 F (36.6 C) (Oral)   Resp 18   Ht 5\' 5"  (1.651 m)   Wt 70.3 kg   LMP 10/16/2022 (Approximate)   SpO2 98%   BMI 25.79 kg/m  Physical Exam Vitals and nursing note reviewed.  Constitutional:      General: She is not in acute distress.    Appearance: She is well-developed. She is not diaphoretic.  HENT:     Head: Normocephalic and atraumatic.  Eyes:     Pupils: Pupils are equal, round, and reactive to light.  Cardiovascular:     Rate and Rhythm: Normal rate and regular rhythm.     Heart sounds: No murmur heard.    No friction rub. No gallop.  Pulmonary:     Effort: Pulmonary effort is normal.     Breath sounds: Wheezing present. No rales.     Comments: Mild tachypnea and diffuse wheezes with prolonged expiratory effort in all fields. Abdominal:     General: There is no distension.     Palpations: Abdomen is soft.     Tenderness: There is no abdominal tenderness.  Musculoskeletal:        General: No tenderness.     Cervical back: Normal  range of motion and neck supple.  Skin:    General: Skin is warm and dry.  Neurological:     Mental Status: She is alert and oriented to person, place, and time.  Psychiatric:        Behavior: Behavior normal.     ED Results / Procedures / Treatments   Labs (all labs ordered are listed, but only abnormal results are displayed) Labs Reviewed  URINALYSIS, ROUTINE W REFLEX MICROSCOPIC - Abnormal; Notable for the following components:      Result Value   Leukocytes,Ua TRACE (*)    All other components within normal limits  URINALYSIS, MICROSCOPIC (REFLEX) - Abnormal; Notable for the following components:   Bacteria, UA MANY (*)    All other components within normal limits  PREGNANCY, URINE    EKG None  Radiology No results found.  Procedures Procedures    Medications Ordered in  ED Medications  fosfomycin (MONUROL) packet 3 g (has no administration in time range)  albuterol (VENTOLIN HFA) 108 (90 Base) MCG/ACT inhaler 8 puff (8 puffs Inhalation Given 10/29/22 1126)  aerochamber plus with mask device 1 each (1 each Other Given 10/29/22 1127)  predniSONE (DELTASONE) tablet 60 mg (60 mg Oral Given 10/29/22 1132)    ED Course/ Medical Decision Making/ A&P                             Medical Decision Making Amount and/or Complexity of Data Reviewed Labs: ordered.  Risk Prescription drug management.   42 yo F with a chief complaint of difficulty breathing has been going on for about 4 days of sensor out of her albuterol inhaler.  Also having some urinary symptoms.  UA interestingly without white cells or red cells but on microscopic that was triggered show too numerous to count bacteria.  Not sure how that is clinically significant.  Will give a dose of fosfomycin here.  PCP follow-up.  Patient is feeling much better on repeat assessment.  Will discharge home.  PCP follow-up.  12:24 PM:  I have discussed the diagnosis/risks/treatment options with the patient.  Evaluation and diagnostic testing in the emergency department does not suggest an emergent condition requiring admission or immediate intervention beyond what has been performed at this time.  They will follow up with PCP. We also discussed returning to the ED immediately if new or worsening sx occur. We discussed the sx which are most concerning (e.g., sudden worsening pain, fever, inability to tolerate by mouth, need to use inhaler more often than every 4 hours) that necessitate immediate return. Medications administered to the patient during their visit and any new prescriptions provided to the patient are listed below.  Medications given during this visit Medications  fosfomycin (MONUROL) packet 3 g (has no administration in time range)  albuterol (VENTOLIN HFA) 108 (90 Base) MCG/ACT inhaler 8 puff (8 puffs  Inhalation Given 10/29/22 1126)  aerochamber plus with mask device 1 each (1 each Other Given 10/29/22 1127)  predniSONE (DELTASONE) tablet 60 mg (60 mg Oral Given 10/29/22 1132)     The patient appears reasonably screen and/or stabilized for discharge and I doubt any other medical condition or other [facility] requiring further screening, evaluation, or treatment in the ED at this time prior to discharge.          Final Clinical Impression(s) / ED Diagnoses Final diagnoses:  Moderate persistent asthma with exacerbation    Rx / DC Orders ED  Discharge Orders          Ordered    predniSONE (DELTASONE) 20 MG tablet        10/29/22 [place], [place], DO 10/29/22 1224

## 2022-10-29 NOTE — ED Triage Notes (Signed)
C/o frequent urination, malodorous urine x 1 week, some discomfort to left abdomen. Also c/o shortness of breath x 4 days. Wheezing noted in triage, ran out of inhalers 3 days ago hx of asthma.
